# Patient Record
Sex: Female | Born: 1965
Health system: Southern US, Community
[De-identification: ages and names within clinical notes are randomized; demographics above are authoritative.]

## PROBLEM LIST (undated history)

## (undated) DIAGNOSIS — J988 Other specified respiratory disorders: Secondary | ICD-10-CM

## (undated) DIAGNOSIS — T7840XA Allergy, unspecified, initial encounter: Secondary | ICD-10-CM

## (undated) DIAGNOSIS — Z8619 Personal history of other infectious and parasitic diseases: Secondary | ICD-10-CM

## (undated) DIAGNOSIS — Z124 Encounter for screening for malignant neoplasm of cervix: Secondary | ICD-10-CM

## (undated) DIAGNOSIS — K219 Gastro-esophageal reflux disease without esophagitis: Secondary | ICD-10-CM

## (undated) DIAGNOSIS — E785 Hyperlipidemia, unspecified: Secondary | ICD-10-CM

## (undated) DIAGNOSIS — F419 Anxiety disorder, unspecified: Secondary | ICD-10-CM

## (undated) DIAGNOSIS — Z8719 Personal history of other diseases of the digestive system: Secondary | ICD-10-CM

## (undated) DIAGNOSIS — Z720 Tobacco use: Secondary | ICD-10-CM

## (undated) DIAGNOSIS — F329 Major depressive disorder, single episode, unspecified: Secondary | ICD-10-CM

## (undated) DIAGNOSIS — B9789 Other viral agents as the cause of diseases classified elsewhere: Secondary | ICD-10-CM

## (undated) DIAGNOSIS — K859 Acute pancreatitis without necrosis or infection, unspecified: Secondary | ICD-10-CM

## (undated) HISTORY — DX: Personal history of other infectious and parasitic diseases: Z86.19

## (undated) HISTORY — DX: Allergy, unspecified, initial encounter: T78.40XA

## (undated) HISTORY — DX: Other viral agents as the cause of diseases classified elsewhere: B97.89

## (undated) HISTORY — PX: TUBAL LIGATION: SHX77

## (undated) HISTORY — DX: Hyperlipidemia, unspecified: E78.5

## (undated) HISTORY — DX: Gastro-esophageal reflux disease without esophagitis: K21.9

## (undated) HISTORY — DX: Encounter for screening for malignant neoplasm of cervix: Z12.4

## (undated) HISTORY — DX: Major depressive disorder, single episode, unspecified: F32.9

## (undated) HISTORY — DX: Tobacco use: Z72.0

## (undated) HISTORY — DX: Personal history of other diseases of the digestive system: Z87.19

## (undated) HISTORY — DX: Other specified respiratory disorders: J98.8

## (undated) HISTORY — DX: Anxiety disorder, unspecified: F41.9

## (undated) HISTORY — DX: Acute pancreatitis without necrosis or infection, unspecified: K85.90

---

## 2005-12-18 LAB — HM MAMMOGRAPHY: HM Mammogram: NORMAL

## 2011-12-19 LAB — HM PAP SMEAR: HM Pap smear: NORMAL

## 2016-01-31 ENCOUNTER — Telehealth: Payer: Self-pay | Admitting: *Deleted

## 2016-01-31 ENCOUNTER — Encounter: Payer: Self-pay | Admitting: *Deleted

## 2016-01-31 NOTE — Telephone Encounter (Signed)
Pre-Visit Call completed with patient and chart updated.   Pre-Visit Info documented in Specialty Comments under [employer].    

## 2016-01-31 NOTE — Telephone Encounter (Signed)
Unable to reach patient at time of pre-visit call. Left message for patient to return call when available.  

## 2016-02-01 ENCOUNTER — Ambulatory Visit (INDEPENDENT_AMBULATORY_CARE_PROVIDER_SITE_OTHER): Payer: BLUE CROSS/BLUE SHIELD | Admitting: Family Medicine

## 2016-02-01 ENCOUNTER — Encounter: Payer: Self-pay | Admitting: Family Medicine

## 2016-02-01 VITALS — BP 108/70 | HR 90 | Temp 98.0°F | Ht 60.0 in | Wt 130.4 lb

## 2016-02-01 DIAGNOSIS — Z8619 Personal history of other infectious and parasitic diseases: Secondary | ICD-10-CM | POA: Insufficient documentation

## 2016-02-01 DIAGNOSIS — G4700 Insomnia, unspecified: Secondary | ICD-10-CM

## 2016-02-01 DIAGNOSIS — Z1239 Encounter for other screening for malignant neoplasm of breast: Secondary | ICD-10-CM

## 2016-02-01 DIAGNOSIS — Z0000 Encounter for general adult medical examination without abnormal findings: Secondary | ICD-10-CM

## 2016-02-01 DIAGNOSIS — K219 Gastro-esophageal reflux disease without esophagitis: Secondary | ICD-10-CM | POA: Diagnosis not present

## 2016-02-01 DIAGNOSIS — F418 Other specified anxiety disorders: Secondary | ICD-10-CM

## 2016-02-01 DIAGNOSIS — T7840XA Allergy, unspecified, initial encounter: Secondary | ICD-10-CM

## 2016-02-01 DIAGNOSIS — F329 Major depressive disorder, single episode, unspecified: Secondary | ICD-10-CM

## 2016-02-01 DIAGNOSIS — F419 Anxiety disorder, unspecified: Secondary | ICD-10-CM

## 2016-02-01 DIAGNOSIS — F32A Depression, unspecified: Secondary | ICD-10-CM

## 2016-02-01 MED ORDER — ESCITALOPRAM OXALATE 10 MG PO TABS: 10.0000 mg | ORAL_TABLET | Freq: Every day | ORAL | Status: DC

## 2016-02-01 MED ORDER — ZOLPIDEM TARTRATE 10 MG PO TABS
10.0000 mg | ORAL_TABLET | Freq: Every evening | ORAL | Status: DC | PRN
Start: 2016-02-01 — End: 2016-10-12

## 2016-02-01 NOTE — Patient Instructions (Signed)
NOW probiotics 10 strains in 1 cap daily [email or web address] 64 oz of clear fluids Psyllium/Metmucil powder in juice Benefiber daily   Preventive Care for Adults, Female A healthy lifestyle and preventive care can promote health and wellness. Preventive health guidelines for women include the following key practices.  A routine yearly physical is a good way to check with your health care provider about your health and preventive screening. It is a chance to share any concerns and updates on your health and to receive a thorough exam.  Visit your dentist for a routine exam and preventive care every 6 months. Brush your teeth twice a day and floss once a day. Good oral hygiene prevents tooth decay and gum disease.  The frequency of eye exams is based on your age, health, family medical history, use of contact lenses, and other factors. Follow your health care provider's recommendations for frequency of eye exams.  Eat a healthy diet. Foods like vegetables, fruits, whole grains, low-fat dairy products, and lean protein foods contain the nutrients you need without too many calories. Decrease your intake of foods high in solid fats, added sugars, and salt. Eat the right amount of calories for you.Get information about a proper diet from your health care provider, if necessary.  Regular physical exercise is one of the most important things you can do for your health. Most adults should get at least 150 minutes of moderate-intensity exercise (any activity that increases your heart rate and causes you to sweat) each week. In addition, most adults need muscle-strengthening exercises on 2 or more days a week.  Maintain a healthy weight. The body mass index (BMI) is a screening tool to identify possible weight problems. It provides an estimate of body fat based on height and weight. Your health care provider can find your BMI and can help you achieve or maintain a healthy weight.For adults 20 years and  older:  A BMI below 18.5 is considered underweight.  A BMI of 18.5 to 24.9 is normal.  A BMI of 25 to 29.9 is considered overweight.  A BMI of 30 and above is considered obese.  Maintain normal blood lipids and cholesterol levels by exercising and minimizing your intake of saturated fat. Eat a balanced diet with plenty of fruit and vegetables. Blood tests for lipids and cholesterol should begin at age 52 and be repeated every 5 years. If your lipid or cholesterol levels are high, you are over 50, or you are at high risk for heart disease, you may need your cholesterol levels checked more frequently.Ongoing high lipid and cholesterol levels should be treated with medicines if diet and exercise are not working.  If you smoke, find out from your health care provider how to quit. If you do not use tobacco, do not start.  Lung cancer screening is recommended for adults aged 34-80 years who are at high risk for developing lung cancer because of a history of smoking. A yearly low-dose CT scan of the lungs is recommended for people who have at least a 30-pack-year history of smoking and are a current smoker or have quit within the past 15 years. A pack year of smoking is smoking an average of 1 pack of cigarettes a day for 1 year (for example: 1 pack a day for 30 years or 2 packs a day for 15 years). Yearly screening should continue until the smoker has stopped smoking for at least 15 years. Yearly screening should be stopped for people who develop a health  problem that would prevent them from having lung cancer treatment.  If you are pregnant, do not drink alcohol. If you are breastfeeding, be very cautious about drinking alcohol. If you are not pregnant and choose to drink alcohol, do not have more than 1 drink per day. One drink is considered to be 12 ounces (355 mL) of beer, 5 ounces (148 mL) of wine, or 1.5 ounces (44 mL) of liquor.  Avoid use of street drugs. Do not share needles with anyone. Ask  for help if you need support or instructions about stopping the use of drugs.  High blood pressure causes heart disease and increases the risk of stroke. Your blood pressure should be checked at least every 1 to 2 years. Ongoing high blood pressure should be treated with medicines if weight loss and exercise do not work.  If you are 38-49 years old, ask your health care provider if you should take aspirin to prevent strokes.  Diabetes screening is done by taking a blood sample to check your blood glucose level after you have not eaten for a certain period of time (fasting). If you are not overweight and you do not have risk factors for diabetes, you should be screened once every 3 years starting at age 37. If you are overweight or obese and you are 77-58 years of age, you should be screened for diabetes every year as part of your cardiovascular risk assessment.  Breast cancer screening is essential preventive care for women. You should practice "breast self-awareness." This means understanding the normal appearance and feel of your breasts and may include breast self-examination. Any changes detected, no matter how small, should be reported to a health care provider. Women in their 38s and 30s should have a clinical breast exam (CBE) by a health care provider as part of a regular health exam every 1 to 3 years. After age 36, women should have a CBE every year. Starting at age 52, women should consider having a mammogram (breast X-ray test) every year. Women who have a family history of breast cancer should talk to their health care provider about genetic screening. Women at a high risk of breast cancer should talk to their health care providers about having an MRI and a mammogram every year.  Breast cancer gene (BRCA)-related cancer risk assessment is recommended for women who have family members with BRCA-related cancers. BRCA-related cancers include breast, ovarian, tubal, and peritoneal cancers. Having  family members with these cancers may be associated with an increased risk for harmful changes (mutations) in the breast cancer genes BRCA1 and BRCA2. Results of the assessment will determine the need for genetic counseling and BRCA1 and BRCA2 testing.  Your health care provider may recommend that you be screened regularly for cancer of the pelvic organs (ovaries, uterus, and vagina). This screening involves a pelvic examination, including checking for microscopic changes to the surface of your cervix (Pap test). You may be encouraged to have this screening done every 3 years, beginning at age 63.  For women ages 63-65, health care providers may recommend pelvic exams and Pap testing every 3 years, or they may recommend the Pap and pelvic exam, combined with testing for human papilloma virus (HPV), every 5 years. Some types of HPV increase your risk of cervical cancer. Testing for HPV may also be done on women of any age with unclear Pap test results.  Other health care providers may not recommend any screening for nonpregnant women who are considered low risk  for pelvic cancer and who do not have symptoms. Ask your health care provider if a screening pelvic exam is right for you.  If you have had past treatment for cervical cancer or a condition that could lead to cancer, you need Pap tests and screening for cancer for at least 20 years after your treatment. If Pap tests have been discontinued, your risk factors (such as having a new sexual partner) need to be reassessed to determine if screening should resume. Some women have medical problems that increase the chance of getting cervical cancer. In these cases, your health care provider may recommend more frequent screening and Pap tests.  Colorectal cancer can be detected and often prevented. Most routine colorectal cancer screening begins at the age of 45 years and continues through age 47 years. However, your health care provider may recommend  screening at an earlier age if you have risk factors for colon cancer. On a yearly basis, your health care provider may provide home test kits to check for hidden blood in the stool. Use of a small camera at the end of a tube, to directly examine the colon (sigmoidoscopy or colonoscopy), can detect the earliest forms of colorectal cancer. Talk to your health care provider about this at age 68, when routine screening begins. Direct exam of the colon should be repeated every 5-10 years through age 57 years, unless early forms of precancerous polyps or small growths are found.  People who are at an increased risk for hepatitis B should be screened for this virus. You are considered at high risk for hepatitis B if:  You were born in a country where hepatitis B occurs often. Talk with your health care provider about which countries are considered high risk.  Your parents were born in a high-risk country and you have not received a shot to protect against hepatitis B (hepatitis B vaccine).  You have HIV or AIDS.  You use needles to inject street drugs.  You live with, or have sex with, someone who has hepatitis B.  You get hemodialysis treatment.  You take certain medicines for conditions like cancer, organ transplantation, and autoimmune conditions.  Hepatitis C blood testing is recommended for all people born from [birth year] through 1965 and any individual with known risks for hepatitis C.  Practice safe sex. Use condoms and avoid high-risk sexual practices to reduce the spread of sexually transmitted infections (STIs). STIs include gonorrhea, chlamydia, syphilis, trichomonas, herpes, HPV, and human immunodeficiency virus (HIV). Herpes, HIV, and HPV are viral illnesses that have no cure. They can result in disability, cancer, and death.  You should be screened for sexually transmitted illnesses (STIs) including gonorrhea and chlamydia if:  You are sexually active and are younger than 24 years.  You  are older than 24 years and your health care provider tells you that you are at risk for this type of infection.  Your sexual activity has changed since you were last screened and you are at an increased risk for chlamydia or gonorrhea. Ask your health care provider if you are at risk.  If you are at risk of being infected with HIV, it is recommended that you take a prescription medicine daily to prevent HIV infection. This is called preexposure prophylaxis (PrEP). You are considered at risk if:  You are sexually active and do not regularly use condoms or know the HIV status of your partner(s).  You take drugs by injection.  You are sexually active with a partner who  has HIV.  Talk with your health care provider about whether you are at high risk of being infected with HIV. If you choose to begin PrEP, you should first be tested for HIV. You should then be tested every 3 months for as long as you are taking PrEP.  Osteoporosis is a disease in which the bones lose minerals and strength with aging. This can result in serious bone fractures or breaks. The risk of osteoporosis can be identified using a bone density scan. Women ages 9 years and over and women at risk for fractures or osteoporosis should discuss screening with their health care providers. Ask your health care provider whether you should take a calcium supplement or vitamin D to reduce the rate of osteoporosis.  Menopause can be associated with physical symptoms and risks. Hormone replacement therapy is available to decrease symptoms and risks. You should talk to your health care provider about whether hormone replacement therapy is right for you.  Use sunscreen. Apply sunscreen liberally and repeatedly throughout the day. You should seek shade when your shadow is shorter than you. Protect yourself by wearing long sleeves, pants, a wide-brimmed hat, and sunglasses year round, whenever you are outdoors.  Once a month, do a whole body  skin exam, using a mirror to look at the skin on your back. Tell your health care provider of new moles, moles that have irregular borders, moles that are larger than a pencil eraser, or moles that have changed in shape or color.  Stay current with required vaccines (immunizations).  Influenza vaccine. All adults should be immunized every year.  Tetanus, diphtheria, and acellular pertussis (Td, Tdap) vaccine. Pregnant women should receive 1 dose of Tdap vaccine during each pregnancy. The dose should be obtained regardless of the length of time since the last dose. Immunization is preferred during the 27th-36th week of gestation. An adult who has not previously received Tdap or who does not know her vaccine status should receive 1 dose of Tdap. This initial dose should be followed by tetanus and diphtheria toxoids (Td) booster doses every 10 years. Adults with an unknown or incomplete history of completing a 3-dose immunization series with Td-containing vaccines should begin or complete a primary immunization series including a Tdap dose. Adults should receive a Td booster every 10 years.  Varicella vaccine. An adult without evidence of immunity to varicella should receive 2 doses or a second dose if she has previously received 1 dose. Pregnant females who do not have evidence of immunity should receive the first dose after pregnancy. This first dose should be obtained before leaving the health care facility. The second dose should be obtained 4-8 weeks after the first dose.  Human papillomavirus (HPV) vaccine. Females aged 13-26 years who have not received the vaccine previously should obtain the 3-dose series. The vaccine is not recommended for use in pregnant females. However, pregnancy testing is not needed before receiving a dose. If a female is found to be pregnant after receiving a dose, no treatment is needed. In that case, the remaining doses should be delayed until after the pregnancy.  Immunization is recommended for any person with an immunocompromised condition through the age of 77 years if she did not get any or all doses earlier. During the 3-dose series, the second dose should be obtained 4-8 weeks after the first dose. The third dose should be obtained 24 weeks after the first dose and 16 weeks after the second dose.  Zoster vaccine. One dose is  recommended for adults aged 42 years or older unless certain conditions are present.  Measles, mumps, and rubella (MMR) vaccine. Adults born before [birth year] generally are considered immune to measles and mumps. Adults born in [birth year] or later should have 1 or more doses of MMR vaccine unless there is a contraindication to the vaccine or there is laboratory evidence of immunity to each of the three diseases. A routine second dose of MMR vaccine should be obtained at least 28 days after the first dose for students attending postsecondary schools, health care workers, or international travelers. People who received inactivated measles vaccine or an unknown type of measles vaccine during 1963-1967 should receive 2 doses of MMR vaccine. People who received inactivated mumps vaccine or an unknown type of mumps vaccine before 1979 and are at high risk for mumps infection should consider immunization with 2 doses of MMR vaccine. For females of childbearing age, rubella immunity should be determined. If there is no evidence of immunity, females who are not pregnant should be vaccinated. If there is no evidence of immunity, females who are pregnant should delay immunization until after pregnancy. Unvaccinated health care workers born before [birth year] who lack laboratory evidence of measles, mumps, or rubella immunity or laboratory confirmation of disease should consider measles and mumps immunization with 2 doses of MMR vaccine or rubella immunization with 1 dose of MMR vaccine.  Pneumococcal 13-valent conjugate (PCV13) vaccine. When indicated, a person who is  uncertain of his immunization history and has no record of immunization should receive the PCV13 vaccine. All adults 31 years of age and older should receive this vaccine. An adult aged 18 years or older who has certain medical conditions and has not been previously immunized should receive 1 dose of PCV13 vaccine. This PCV13 should be followed with a dose of pneumococcal polysaccharide (PPSV23) vaccine. Adults who are at high risk for pneumococcal disease should obtain the PPSV23 vaccine at least 8 weeks after the dose of PCV13 vaccine. Adults older than 50 years of age who have normal immune system function should obtain the PPSV23 vaccine dose at least 1 year after the dose of PCV13 vaccine.  Pneumococcal polysaccharide (PPSV23) vaccine. When PCV13 is also indicated, PCV13 should be obtained first. All adults aged 69 years and older should be immunized. An adult younger than age 64 years who has certain medical conditions should be immunized. Any person who resides in a nursing home or long-term care facility should be immunized. An adult smoker should be immunized. People with an immunocompromised condition and certain other conditions should receive both PCV13 and PPSV23 vaccines. People with human immunodeficiency virus (HIV) infection should be immunized as soon as possible after diagnosis. Immunization during chemotherapy or radiation therapy should be avoided. Routine use of PPSV23 vaccine is not recommended for American Indians, [place]atives, or people younger than 65 years unless there are medical conditions that require PPSV23 vaccine. When indicated, people who have unknown immunization and have no record of immunization should receive PPSV23 vaccine. One-time revaccination 5 years after the first dose of PPSV23 is recommended for people aged 19-64 years who have chronic kidney failure, nephrotic syndrome, asplenia, or immunocompromised conditions. People who received 1-2 doses of PPSV23 before age  92 years should receive another dose of PPSV23 vaccine at age 56 years or later if at least 5 years have passed since the previous dose. Doses of PPSV23 are not needed for people immunized with PPSV23 at or after age 16 years.  Meningococcal vaccine. Adults  with asplenia or persistent complement component deficiencies should receive 2 doses of quadrivalent meningococcal conjugate (MenACWY-D) vaccine. The doses should be obtained at least 2 months apart. Microbiologists working with certain meningococcal bacteria, [employer] recruits, people at risk during an outbreak, and people who travel to or live in countries with a high rate of meningitis should be immunized. A first-year college student up through age 30 years who is living in a residence hall should receive a dose if she did not receive a dose on or after her 16th birthday. Adults who have certain high-risk conditions should receive one or more doses of vaccine.  Hepatitis A vaccine. Adults who wish to be protected from this disease, have certain high-risk conditions, work with hepatitis A-infected animals, work in hepatitis A research labs, or travel to or work in countries with a high rate of hepatitis A should be immunized. Adults who were previously unvaccinated and who anticipate close contact with an international adoptee during the first 60 days after arrival in the [place] from a country with a high rate of hepatitis A should be immunized.  Hepatitis B vaccine. Adults who wish to be protected from this disease, have certain high-risk conditions, may be exposed to blood or other infectious body fluids, are household contacts or sex partners of hepatitis B positive people, are clients or workers in certain care facilities, or travel to or work in countries with a high rate of hepatitis B should be immunized.  Haemophilus influenzae type b (Hib) vaccine. A previously unvaccinated person with asplenia or sickle cell disease or having a  scheduled splenectomy should receive 1 dose of Hib vaccine. Regardless of previous immunization, a recipient of a hematopoietic stem cell transplant should receive a 3-dose series 6-12 months after her successful transplant. Hib vaccine is not recommended for adults with HIV infection. Preventive Services / Frequency Ages 51 to 33 years  Blood pressure check.** / Every 3-5 years.  Lipid and cholesterol check.** / Every 5 years beginning at age 37.  Clinical breast exam.** / Every 3 years for women in their 64s and 10s.  BRCA-related cancer risk assessment.** / For women who have family members with a BRCA-related cancer (breast, ovarian, tubal, or peritoneal cancers).  Pap test.** / Every 2 years from ages 55 through 49. Every 3 years starting at age 81 through age 49 or 27 with a history of 3 consecutive normal Pap tests.  HPV screening.** / Every 3 years from ages 64 through ages 94 to 64 with a history of 3 consecutive normal Pap tests.  Hepatitis C blood test.** / For any individual with known risks for hepatitis C.  Skin self-exam. / Monthly.  Influenza vaccine. / Every year.  Tetanus, diphtheria, and acellular pertussis (Tdap, Td) vaccine.** / Consult your health care provider. Pregnant women should receive 1 dose of Tdap vaccine during each pregnancy. 1 dose of Td every 10 years.  Varicella vaccine.** / Consult your health care provider. Pregnant females who do not have evidence of immunity should receive the first dose after pregnancy.  HPV vaccine. / 3 doses over 6 months, if 34 and younger. The vaccine is not recommended for use in pregnant females. However, pregnancy testing is not needed before receiving a dose.  Measles, mumps, rubella (MMR) vaccine.** / You need at least 1 dose of MMR if you were born in 1957 or later. You may also need a 2nd dose. For females of childbearing age, rubella immunity should be determined. If there is  no evidence of immunity, females who are not  pregnant should be vaccinated. If there is no evidence of immunity, females who are pregnant should delay immunization until after pregnancy.  Pneumococcal 13-valent conjugate (PCV13) vaccine.** / Consult your health care provider.  Pneumococcal polysaccharide (PPSV23) vaccine.** / 1 to 2 doses if you smoke cigarettes or if you have certain conditions.  Meningococcal vaccine.** / 1 dose if you are age 48 to 24 years and a Market researcher living in a residence hall, or have one of several medical conditions, you need to get vaccinated against meningococcal disease. You may also need additional booster doses.  Hepatitis A vaccine.** / Consult your health care provider.  Hepatitis B vaccine.** / Consult your health care provider.  Haemophilus influenzae type b (Hib) vaccine.** / Consult your health care provider. Ages 46 to 50 years  Blood pressure check.** / Every year.  Lipid and cholesterol check.** / Every 5 years beginning at age 64 years.  Lung cancer screening. / Every year if you are aged 75-80 years and have a 30-pack-year history of smoking and currently smoke or have quit within the past 15 years. Yearly screening is stopped once you have quit smoking for at least 15 years or develop a health problem that would prevent you from having lung cancer treatment.  Clinical breast exam.** / Every year after age 28 years.  BRCA-related cancer risk assessment.** / For women who have family members with a BRCA-related cancer (breast, ovarian, tubal, or peritoneal cancers).  Mammogram.** / Every year beginning at age 28 years and continuing for as long as you are in good health. Consult with your health care provider.  Pap test.** / Every 3 years starting at age 39 years through age 17 or 32 years with a history of 3 consecutive normal Pap tests.  HPV screening.** / Every 3 years from ages 61 years through ages 2 to 48 years with a history of 3 consecutive normal Pap  tests.  Fecal occult blood test (FOBT) of stool. / Every year beginning at age 43 years and continuing until age 14 years. You may not need to do this test if you get a colonoscopy every 10 years.  Flexible sigmoidoscopy or colonoscopy.** / Every 5 years for a flexible sigmoidoscopy or every 10 years for a colonoscopy beginning at age 68 years and continuing until age 38 years.  Hepatitis C blood test.** / For all people born from [birth year] through 1965 and any individual with known risks for hepatitis C.  Skin self-exam. / Monthly.  Influenza vaccine. / Every year.  Tetanus, diphtheria, and acellular pertussis (Tdap/Td) vaccine.** / Consult your health care provider. Pregnant women should receive 1 dose of Tdap vaccine during each pregnancy. 1 dose of Td every 10 years.  Varicella vaccine.** / Consult your health care provider. Pregnant females who do not have evidence of immunity should receive the first dose after pregnancy.  Zoster vaccine.** / 1 dose for adults aged 72 years or older.  Measles, mumps, rubella (MMR) vaccine.** / You need at least 1 dose of MMR if you were born in 1957 or later. You may also need a second dose. For females of childbearing age, rubella immunity should be determined. If there is no evidence of immunity, females who are not pregnant should be vaccinated. If there is no evidence of immunity, females who are pregnant should delay immunization until after pregnancy.  Pneumococcal 13-valent conjugate (PCV13) vaccine.** / Consult your health care provider.  Pneumococcal  polysaccharide (PPSV23) vaccine.** / 1 to 2 doses if you smoke cigarettes or if you have certain conditions.  Meningococcal vaccine.** / Consult your health care provider.  Hepatitis A vaccine.** / Consult your health care provider.  Hepatitis B vaccine.** / Consult your health care provider.  Haemophilus influenzae type b (Hib) vaccine.** / Consult your health care provider. Ages 78 years and  over  Blood pressure check.** / Every year.  Lipid and cholesterol check.** / Every 5 years beginning at age 24 years.  Lung cancer screening. / Every year if you are aged 87-80 years and have a 30-pack-year history of smoking and currently smoke or have quit within the past 15 years. Yearly screening is stopped once you have quit smoking for at least 15 years or develop a health problem that would prevent you from having lung cancer treatment.  Clinical breast exam.** / Every year after age 34 years.  BRCA-related cancer risk assessment.** / For women who have family members with a BRCA-related cancer (breast, ovarian, tubal, or peritoneal cancers).  Mammogram.** / Every year beginning at age 59 years and continuing for as long as you are in good health. Consult with your health care provider.  Pap test.** / Every 3 years starting at age 76 years through age 72 or 5 years with 3 consecutive normal Pap tests. Testing can be stopped between 65 and 70 years with 3 consecutive normal Pap tests and no abnormal Pap or HPV tests in the past 10 years.  HPV screening.** / Every 3 years from ages 68 years through ages 51 or 56 years with a history of 3 consecutive normal Pap tests. Testing can be stopped between 65 and 70 years with 3 consecutive normal Pap tests and no abnormal Pap or HPV tests in the past 10 years.  Fecal occult blood test (FOBT) of stool. / Every year beginning at age 75 years and continuing until age 62 years. You may not need to do this test if you get a colonoscopy every 10 years.  Flexible sigmoidoscopy or colonoscopy.** / Every 5 years for a flexible sigmoidoscopy or every 10 years for a colonoscopy beginning at age 59 years and continuing until age 33 years.  Hepatitis C blood test.** / For all people born from [birth year] through 1965 and any individual with known risks for hepatitis C.  Osteoporosis screening.** / A one-time screening for women ages 58 years and over and women  at risk for fractures or osteoporosis.  Skin self-exam. / Monthly.  Influenza vaccine. / Every year.  Tetanus, diphtheria, and acellular pertussis (Tdap/Td) vaccine.** / 1 dose of Td every 10 years.  Varicella vaccine.** / Consult your health care provider.  Zoster vaccine.** / 1 dose for adults aged 96 years or older.  Pneumococcal 13-valent conjugate (PCV13) vaccine.** / Consult your health care provider.  Pneumococcal polysaccharide (PPSV23) vaccine.** / 1 dose for all adults aged 59 years and older.  Meningococcal vaccine.** / Consult your health care provider.  Hepatitis A vaccine.** / Consult your health care provider.  Hepatitis B vaccine.** / Consult your health care provider.  Haemophilus influenzae type b (Hib) vaccine.** / Consult your health care provider. ** Family history and personal history of risk and conditions may change your health care provider's recommendations.   This information is not intended to replace advice given to you by your health care provider. Make sure you discuss any questions you have with your health care provider.   Document Released: 01/30/2002 Document Revised: 12/25/2014  Document Reviewed: 05/01/2011 Elsevier Interactive Patient Education [employer].

## 2016-02-01 NOTE — Progress Notes (Signed)
Pre visit review using our clinic review tool, if applicable. No additional management support is needed unless otherwise documented below in the visit note. 

## 2016-02-06 ENCOUNTER — Encounter: Payer: Self-pay | Admitting: Family Medicine

## 2016-02-06 DIAGNOSIS — G4700 Insomnia, unspecified: Secondary | ICD-10-CM | POA: Insufficient documentation

## 2016-02-06 DIAGNOSIS — K219 Gastro-esophageal reflux disease without esophagitis: Secondary | ICD-10-CM | POA: Insufficient documentation

## 2016-02-06 DIAGNOSIS — F32A Depression, unspecified: Secondary | ICD-10-CM | POA: Insufficient documentation

## 2016-02-06 DIAGNOSIS — Z0000 Encounter for general adult medical examination without abnormal findings: Secondary | ICD-10-CM | POA: Insufficient documentation

## 2016-02-06 DIAGNOSIS — T7840XA Allergy, unspecified, initial encounter: Secondary | ICD-10-CM | POA: Insufficient documentation

## 2016-02-06 DIAGNOSIS — F329 Major depressive disorder, single episode, unspecified: Secondary | ICD-10-CM | POA: Insufficient documentation

## 2016-02-06 DIAGNOSIS — F419 Anxiety disorder, unspecified: Secondary | ICD-10-CM

## 2016-02-06 HISTORY — DX: Depression, unspecified: F32.A

## 2016-02-06 NOTE — Assessment & Plan Note (Signed)
Avoid offending foods, start probiotics. Do not eat large meals in late evening and consider raising head of bed.  

## 2016-02-06 NOTE — Assessment & Plan Note (Signed)
Encouraged good sleep hygiene such as dark, quiet room. No blue/green glowing lights such as computer screens in bedroom. No alcohol or stimulants in evening. Cut down on caffeine as able. Regular exercise is helpful but not just prior to bed time. Allowed Ambien prn

## 2016-02-06 NOTE — Assessment & Plan Note (Signed)
fexofendine prn

## 2016-02-06 NOTE — Assessment & Plan Note (Signed)
Lexapro prescribed. Reassess at next visit or as needed

## 2016-02-06 NOTE — Progress Notes (Signed)
Patient ID: Jarissa Sheriff, female   DOB: 17-Apr-1966, 50 y.o.   MRN: [id number]   Subjective:    Patient ID: Devonna Oboyle, female    DOB: 1966-10-10, 50 y.o.   MRN: [id number]  Chief Complaint  Patient presents with  . Establish Care    HPI Patient is in today for new patient appointment. She struggles with allergies, heartburn and more recently has been struggled with increased stress, anhedonia and poor sleep. Her step father who raised her has just died and she is helping her mother. Also her 21 sister has died. She is having trouble is staying asleep and falling asleep has been worsening over past 3 months as has her anhedonia. She denies suicidal ideation. Denies CP/palp/SOB/HA/congestion/fevers/GI or GU c/o. Taking meds as prescribed  Past Medical History  Diagnosis Date  . History of chicken pox   . Allergy   . GERD (gastroesophageal reflux disease)   . Anxiety and depression 02/06/2016    Past Surgical History  Procedure Laterality Date  . Tubal ligation  "about 20 yrs ago"    Family History  Problem Relation Age of Onset  . Diabetes Maternal Grandmother   . Drug abuse Father   . Cancer Father   . Cirrhosis Sister   . Alcohol abuse Sister   . Diverticulitis Mother   . Cancer Daughter     skin  . Aneurysm Maternal Aunt     brain    Social History   Social History  . Marital Status: Divorced    Spouse Name: N/A  . Number of Children: N/A  . Years of Education: 12   Occupational History  . [employer]    Social History Main Topics  . Smoking status: Current Every Day Smoker -- 1.00 packs/day for 30 years    Types: Cigarettes  . Smokeless tobacco: Not on file  . Alcohol Use: 0.0 oz/week    0 Standard drinks or equivalent per week     Comment: occasional  . Drug Use: No  . 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 Not on file    Social History Narrative    Outpatient Prescriptions Prior to Visit  Medication Sig Dispense Refill  . acetaminophen (TYLENOL) 325 MG tablet Take 650 mg by mouth every 6 (six) hours as needed for headache.     No facility-administered medications prior to visit.    No Known Allergies  Review of Systems  Constitutional: Positive for malaise/fatigue. Negative for fever and chills.  HENT: Negative for congestion and hearing loss.   Eyes: Negative for discharge.  Respiratory: Negative for cough, sputum production and shortness of breath.   Cardiovascular: Negative for chest pain, palpitations and leg swelling.  Gastrointestinal: Positive for heartburn. Negative for nausea, vomiting, abdominal pain, diarrhea, constipation and blood in stool.  Genitourinary: Negative for dysuria, urgency, frequency and hematuria.  Musculoskeletal: Negative for myalgias, back pain and falls.  Skin: Negative for rash.  Neurological: Negative for dizziness, sensory change, loss of consciousness, weakness and headaches.  Endo/Heme/Allergies: Negative for environmental allergies. Does not bruise/bleed easily.  Psychiatric/Behavioral: Positive for depression. Negative for suicidal ideas. The patient is nervous/anxious and has insomnia.        Objective:    Physical Exam  Constitutional: She is oriented to person, place, and time. She appears well-developed and well-nourished. No distress.  HENT:  Head: Normocephalic  and atraumatic.  Eyes: Conjunctivae are normal.  Neck: Neck supple. No thyromegaly present.  Cardiovascular: Normal rate, regular rhythm and normal heart sounds.   No murmur heard. Pulmonary/Chest: Effort normal and breath sounds normal. No respiratory distress.  Abdominal: Soft. Bowel sounds are normal. She exhibits no distension and no mass. There is no tenderness.  Musculoskeletal: She exhibits no edema.  Lymphadenopathy:    She has no cervical adenopathy.  Neurological: She is alert  and oriented to person, place, and time.  Skin: Skin is warm and dry.  Psychiatric: She has a normal mood and affect. Her behavior is normal.    BP 108/70 mmHg  Pulse 90  Temp(Src) 98 F (36.7 C) (Oral)  Ht 5' (1.524 m)  Wt 130 lb 6 oz (59.138 kg)  BMI 25.46 kg/m2  SpO2 97%  LMP 01/07/2016 Wt Readings from Last 3 Encounters:  02/01/16 130 lb 6 oz (59.138 kg)     No results found for: WBC, HGB, HCT, PLT, GLUCOSE, CHOL, TRIG, HDL, LDLDIRECT, LDLCALC, ALT, AST, NA, K, CL, CREATININE, BUN, CO2, TSH, PSA, INR, GLUF, HGBA1C, MICROALBUR  No results found for: TSH No results found for: WBC, HGB, HCT, MCV, PLT No results found for: NA, K, CHLORIDE, CO2, GLUCOSE, BUN, CREATININE, BILITOT, ALKPHOS, AST, ALT, PROT, ALBUMIN, CALCIUM, ANIONGAP, EGFR, GFR No results found for: CHOL No results found for: HDL No results found for: LDLCALC No results found for: TRIG No results found for: CHOLHDL No results found for: HGBA1C     Assessment & Plan:   Problem List Items Addressed This Visit    Allergy    fexofendine prn      Anxiety and depression    Lexapro prescribed. Reassess at next visit or as needed      GERD (gastroesophageal reflux disease)    Avoid offending foods, start probiotics. Do not eat large meals in late evening and consider raising head of bed.       Insomnia    Encouraged good sleep hygiene such as dark, quiet room. No blue/green glowing lights such as computer screens in bedroom. No alcohol or stimulants in evening. Cut down on caffeine as able. Regular exercise is helpful but not just prior to bed time. Allowed Ambien prn      Preventative health care    Other Visit Diagnoses    Breast cancer screening    -  Primary    Relevant Orders    MM Digital Screening       I am having Ms. Begley start on escitalopram and zolpidem. I am also having her maintain her acetaminophen.  Meds ordered this encounter  Medications  . escitalopram (LEXAPRO) 10 MG tablet     Sig: Take 1 tablet (10 mg total) by mouth daily.    Dispense:  30 tablet    Refill:  3  . zolpidem (AMBIEN) 10 MG tablet    Sig: Take 1 tablet (10 mg total) by mouth at bedtime as needed for sleep.    Dispense:  30 tablet    Refill:  1     Penni Homans, MD

## 2016-02-08 ENCOUNTER — Ambulatory Visit (HOSPITAL_BASED_OUTPATIENT_CLINIC_OR_DEPARTMENT_OTHER)
Admission: RE | Admit: 2016-02-08 | Discharge: 2016-02-08 | Disposition: A | Discharge status: Home / Self Care | Payer: BLUE CROSS/BLUE SHIELD | Source: Ambulatory Visit | Attending: Family Medicine | Admitting: Family Medicine

## 2016-02-08 DIAGNOSIS — Z1239 Encounter for other screening for malignant neoplasm of breast: Secondary | ICD-10-CM | POA: Diagnosis present

## 2016-02-08 DIAGNOSIS — Z1231 Encounter for screening mammogram for malignant neoplasm of breast: Secondary | ICD-10-CM | POA: Diagnosis not present

## 2016-04-26 ENCOUNTER — Encounter: Payer: Self-pay | Admitting: Medical

## 2016-04-26 ENCOUNTER — Ambulatory Visit (HOSPITAL_BASED_OUTPATIENT_CLINIC_OR_DEPARTMENT_OTHER)
Admission: RE | Admit: 2016-04-26 | Discharge: 2016-04-26 | Disposition: A | Discharge status: Home / Self Care | Payer: BLUE CROSS/BLUE SHIELD | Source: Ambulatory Visit | Attending: Medical | Admitting: Medical

## 2016-04-26 ENCOUNTER — Ambulatory Visit (INDEPENDENT_AMBULATORY_CARE_PROVIDER_SITE_OTHER): Payer: BLUE CROSS/BLUE SHIELD | Admitting: Medical

## 2016-04-26 VITALS — BP 110/74 | HR 80 | Temp 98.3°F | Ht 60.0 in | Wt 132.1 lb

## 2016-04-26 DIAGNOSIS — Z720 Tobacco use: Secondary | ICD-10-CM | POA: Diagnosis not present

## 2016-04-26 DIAGNOSIS — R059 Cough, unspecified: Secondary | ICD-10-CM

## 2016-04-26 DIAGNOSIS — R062 Wheezing: Secondary | ICD-10-CM | POA: Diagnosis not present

## 2016-04-26 DIAGNOSIS — F17200 Nicotine dependence, unspecified, uncomplicated: Secondary | ICD-10-CM

## 2016-04-26 DIAGNOSIS — R05 Cough: Secondary | ICD-10-CM | POA: Diagnosis not present

## 2016-04-26 DIAGNOSIS — J0100 Acute maxillary sinusitis, unspecified: Secondary | ICD-10-CM | POA: Diagnosis not present

## 2016-04-26 DIAGNOSIS — J209 Acute bronchitis, unspecified: Secondary | ICD-10-CM | POA: Diagnosis not present

## 2016-04-26 DIAGNOSIS — R0602 Shortness of breath: Secondary | ICD-10-CM | POA: Diagnosis not present

## 2016-04-26 MED ORDER — HYDROCODONE-HOMATROPINE 5-1.5 MG/5ML PO SYRP: 5.0000 mL | ORAL_SOLUTION | Freq: Three times a day (TID) | ORAL | Status: DC | PRN

## 2016-04-26 MED ORDER — PREDNISONE 20 MG PO TABS: ORAL_TABLET | ORAL | Status: DC

## 2016-04-26 NOTE — Progress Notes (Signed)
Pre visit review using our clinic review tool, if applicable. No additional management support is needed unless otherwise documented below in the visit note. 

## 2016-04-26 NOTE — Progress Notes (Signed)
Subjective:    Patient ID: Monica Dillon, female    DOB: [birth year], 50 y.o.   MRN: [id number]  HPI  Pt in with cough. [facility]. Pt was given zpack and benzonatate. Pt has cough during day and night. Pt has used inhalers in the past with bronchitis. Inhaler is not helping. Pt can feel some wheezing at night. Pt thinks hears gurgle also at night. In morning will bring up mucous. No fever, no chills or sweats. Pt states coughing overall for 10 days.   Pt has no diabetes.  Pt also has some nasal congestion and sinus pressure. Pt can feel pnd.  Pt smokes less than 1/2 pack a day.  Pt actually finished 3 day course azithromycin.     Review of Systems  Constitutional: Negative for fever, chills and fatigue.  HENT: Positive for congestion, postnasal drip and sinus pressure.   Respiratory: Positive for cough and wheezing. Negative for chest tightness and shortness of breath.   Cardiovascular: Negative for chest pain and palpitations.  Musculoskeletal: Negative for back pain.  Neurological: Negative for dizziness, speech difficulty, weakness, numbness and headaches.  Hematological: Negative for adenopathy. Does not bruise/bleed easily.  Psychiatric/Behavioral: Negative for behavioral problems and confusion.    Past Medical History  Diagnosis Date  . History of chicken pox   . Allergy   . GERD (gastroesophageal reflux disease)   . Anxiety and depression 02/06/2016     Social History   Social History  . Marital Status: Divorced    Spouse Name: N/A  . Number of Children: N/A  . Years of Education: 12   Occupational History  . [employer]    Social History Main Topics  . Smoking status: Current Every Day Smoker -- 1.00 packs/day for 30 years    Types: Cigarettes  . Smokeless tobacco: Not on file  . Alcohol Use: 0.0 oz/week    0 Standard drinks or equivalent per week     Comment: occasional  . Drug Use: No  . 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 Not on file   Social History Narrative    Past Surgical History  Procedure Laterality Date  . Tubal ligation  "about 20 yrs ago"    Family History  Problem Relation Age of Onset  . Diabetes Maternal Grandmother   . Drug abuse Father   . Cancer Father   . Cirrhosis Sister   . Alcohol abuse Sister   . Diverticulitis Mother   . Cancer Daughter     skin  . Aneurysm Maternal Aunt     brain    No Known Allergies  Current Outpatient Prescriptions on File Prior to Visit  Medication Sig Dispense Refill  . acetaminophen (TYLENOL) 325 MG tablet Take 650 mg by mouth every 6 (six) hours as needed for headache.    . escitalopram (LEXAPRO) 10 MG tablet Take 1 tablet (10 mg total) by mouth daily. 30 tablet 3  . zolpidem (AMBIEN) 10 MG tablet Take 1 tablet (10 mg total) by mouth at bedtime as needed for sleep. 30 tablet 1   No current facility-administered medications on file prior to visit.    BP 110/74 mmHg  Pulse 80  Temp(Src) 98.3 F (36.8 C) (Oral)  Ht 5' (1.524 m)  Wt 132 lb 1.6 oz (59.92 kg)  BMI  25.80 kg/m2  SpO2 96%  LMP 04/01/2016       Objective:   Physical Exam  General  Mental Status - Alert. General Appearance - Well groomed. Not in acute distress.  Skin Rashes- No Rashes.  HEENT Head- Normal. Ear Auditory Canal - Left- Normal. Right - Normal.Tympanic Membrane- Left- Normal. Right- Normal. Eye Sclera/Conjunctiva- Left- Normal. Right- Normal. Nose & Sinuses Nasal Mucosa- Left-  Boggy and Congested. Right-  Boggy and  Congested.Bilateral maxillary and frontal sinus pressure. Mouth & Throat Lips: Upper Lip- Normal: no dryness, cracking, pallor, cyanosis, or vesicular eruption. Lower Lip-Normal: no dryness, cracking, pallor, cyanosis or vesicular eruption. Buccal Mucosa- Bilateral- No Aphthous ulcers. Oropharynx- No  Discharge or Erythema. Tonsils: Characteristics- Bilateral- No Erythema or Congestion. Size/Enlargement- Bilateral- No enlargement. Discharge- bilateral-None.  Neck Neck- Supple. No Masses.   Chest and Lung Exam Auscultation: Breath Sounds:-Clear even and unlabored.  Cardiovascular Auscultation:Rythm- Regular, rate and rhythm. Murmurs & Other Heart Sounds:Ausculatation of the heart reveal- No Murmurs.  Lymphatic Head & Neck General Head & Neck Lymphatics: Bilateral: Description- No Localized lymphadenopathy.       Assessment & Plan:  For bronchitis and sinusitis may give another antibiotic but want to get cxr first and review.  For cough rx hycodan. Stop benzonatate.   For wheezing add taper dose prednisone. Continue albuterol inhaler.  Stop smoking or decrease recommended.  Follow up in 7 days or as needed  Dossie Swor, Ramon DredgeEdward, VF CorporationPA-C

## 2016-04-26 NOTE — Patient Instructions (Signed)
For bronchitis and sinusitis may give another antibiotic but want to get cxr first and review.  For cough rx hycodan. Stop benzonatate.   For wheezing add taper dose prednisone. Continue albuterol inhaler.  Stop smoking or decrease recommended.  Follow up in 7 days or as needed

## 2016-04-28 ENCOUNTER — Telehealth: Payer: Self-pay

## 2016-04-28 MED ORDER — DOXYCYCLINE HYCLATE 100 MG PO TABS: 100.0000 mg | ORAL_TABLET | Freq: Two times a day (BID) | ORAL | Status: DC

## 2016-04-28 NOTE — Telephone Encounter (Signed)
Since pt cough is productive and we are heading toward weekend. I think rx antibiotic  is good idea. I wanted to see how she did with prednisone and cough med I rx'd. But since still productive cough will send in doxycycline to her pharmacy. She could stop allergy medicine and ambien presently. Follow up Monday or Tuesday if not improved.

## 2016-04-28 NOTE — Telephone Encounter (Signed)
Spoke with pt and she states that she is having the cough. She has been taking the prednisone and the cough medication. She states that she had recently started a new allergy medication [place] and she is concerned that it may be affecting the medication that was given at the last visit. Pt states that she is having some "stuff" come up when she coughs. Pt states that she started feeling worse last night. Please advise if she will need to adjust taking the allergy medications while on the medications given at last visit.

## 2016-04-28 NOTE — Telephone Encounter (Signed)
Left message for pt to call back about the medication that was prescribed by E. Saguier at the last visit on 04/26/16.

## 2016-04-28 NOTE — Telephone Encounter (Signed)
Left detailed message for pt advising her of the note below and advised her that if she was not feeling better to follow up next Monday or Tuesday if she was not feeling any better. She was advised to call back with any questions about the medication Ramon DredgeEdward had sent into the pharmacy for her.

## 2016-05-02 ENCOUNTER — Ambulatory Visit (INDEPENDENT_AMBULATORY_CARE_PROVIDER_SITE_OTHER): Payer: BLUE CROSS/BLUE SHIELD | Admitting: Family Medicine

## 2016-05-02 ENCOUNTER — Encounter: Payer: Self-pay | Admitting: Family Medicine

## 2016-05-02 VITALS — BP 112/74 | HR 93 | Temp 98.1°F | Ht 60.0 in | Wt 132.2 lb

## 2016-05-02 DIAGNOSIS — Z0001 Encounter for general adult medical examination with abnormal findings: Secondary | ICD-10-CM | POA: Diagnosis not present

## 2016-05-02 DIAGNOSIS — K219 Gastro-esophageal reflux disease without esophagitis: Secondary | ICD-10-CM

## 2016-05-02 DIAGNOSIS — Z0000 Encounter for general adult medical examination without abnormal findings: Secondary | ICD-10-CM

## 2016-05-02 DIAGNOSIS — R05 Cough: Secondary | ICD-10-CM

## 2016-05-02 DIAGNOSIS — G4700 Insomnia, unspecified: Secondary | ICD-10-CM | POA: Diagnosis not present

## 2016-05-02 DIAGNOSIS — R059 Cough, unspecified: Secondary | ICD-10-CM

## 2016-05-02 MED ORDER — METHYLPREDNISOLONE ACETATE 40 MG/ML IJ SUSP: 40.0000 mg | Freq: Once | INTRAMUSCULAR | Status: DC

## 2016-05-02 MED ORDER — RANITIDINE HCL 300 MG PO TABS: 300.0000 mg | ORAL_TABLET | Freq: Every day | ORAL | Status: DC

## 2016-05-02 MED ORDER — METHYLPREDNISOLONE ACETATE 40 MG/ML IJ SUSP
40.0000 mg | Freq: Once | INTRAMUSCULAR | Status: AC
  Administered 2016-05-02: 40 mg via INTRAMUSCULAR

## 2016-05-02 NOTE — Patient Instructions (Addendum)
Release of record [facility] [address]. Smoking Cessation, Tips for Success If you are ready to quit smoking, congratulations! You have chosen to help yourself be healthier. Cigarettes bring nicotine, tar, carbon monoxide, and other irritants into your body. Your lungs, heart, and blood vessels will be able to work better without these poisons. There are many different ways to quit smoking. Nicotine gum, nicotine patches, a nicotine inhaler, or nicotine nasal spray can help with physical craving. Hypnosis, support groups, and medicines help break the habit of smoking. WHAT THINGS CAN I DO TO MAKE QUITTING EASIER?  Here are some tips to help you quit for good:  Pick a date when you will quit smoking completely. Tell all of your friends and family about your plan to quit on that date.  Do not try to slowly cut down on the number of cigarettes you are smoking. Pick a quit date and quit smoking completely starting on that day.  Throw away all cigarettes.   Clean and remove all ashtrays from your home, work, and car.  On a card, write down your reasons for quitting. Carry the card with you and read it when you get the urge to smoke.  Cleanse your body of nicotine. Drink enough water and fluids to keep your urine clear or pale yellow. Do this after quitting to flush the nicotine from your body.  Learn to predict your moods. Do not let a bad situation be your excuse to have a cigarette. Some situations in your life might tempt you into wanting a cigarette.  Never have "just one" cigarette. It leads to wanting another and another. Remind yourself of your decision to quit.  Change habits associated with smoking. If you smoked while driving or when feeling stressed, try other activities to replace smoking. Stand up when drinking your coffee. Brush your teeth after eating. Sit in a different chair when you read the paper. Avoid alcohol while trying to quit, and try to drink fewer caffeinated  beverages. Alcohol and caffeine may urge you to smoke.  Avoid foods and drinks that can trigger a desire to smoke, such as sugary or spicy foods and alcohol.  Ask people who smoke not to smoke around you.  Have something planned to do right after eating or having a cup of coffee. For example, plan to take a walk or exercise.  Try a relaxation exercise to calm you down and decrease your stress. Remember, you may be tense and nervous for the first 2 weeks after you quit, but this will pass.  Find new activities to keep your hands busy. Play with a pen, coin, or rubber band. Doodle or draw things on paper.  Brush your teeth right after eating. This will help cut down on the craving for the taste of tobacco after meals. You can also try mouthwash.   Use oral substitutes in place of cigarettes. Try using lemon drops, carrots, cinnamon sticks, or chewing gum. Keep them handy so they are available when you have the urge to smoke.  When you have the urge to smoke, try deep breathing.  Designate your home as a nonsmoking area.  If you are a heavy smoker, ask your health care provider about a prescription for nicotine chewing gum. It can ease your withdrawal from nicotine.  Reward yourself. Set aside the cigarette money you save and buy yourself something nice.  Look for support from others. Join a support group or smoking cessation program. Ask someone at home  or at work to help you with your plan to quit smoking.  Always ask yourself, "Do I need this cigarette or is this just a reflex?" Tell yourself, "Today, I choose not to smoke," or "I do not want to smoke." You are reminding yourself of your decision to quit.  Do not replace cigarette smoking with electronic cigarettes (commonly called e-cigarettes). The safety of e-cigarettes is unknown, and some may contain harmful chemicals.  If you relapse, do not give up! Plan ahead and think about what you will do the next time you get the urge to  smoke. HOW WILL I FEEL WHEN I QUIT SMOKING? You may have symptoms of withdrawal because your body is used to nicotine (the addictive substance in cigarettes). You may crave cigarettes, be irritable, feel very hungry, cough often, get headaches, or have difficulty concentrating. The withdrawal symptoms are only temporary. They are strongest when you first quit but will go away within 10-14 days. When withdrawal symptoms occur, stay in control. Think about your reasons for quitting. Remind yourself that these are signs that your body is healing and getting used to being without cigarettes. Remember that withdrawal symptoms are easier to treat than the major diseases that smoking can cause.  Even after the withdrawal is over, expect periodic urges to smoke. However, these cravings are generally short lived and will go away whether you smoke or not. Do not smoke! WHAT RESOURCES ARE AVAILABLE TO HELP ME QUIT SMOKING? Your health care provider can direct you to community resources or hospitals for support, which may include:  Group support.  Education.  Hypnosis.  Therapy.   This information is not intended to replace advice given to you by your health care provider. Make sure you discuss any questions you have with your health care provider.   Document Released: 09/01/2004 Document Revised: 12/25/2014 Document Reviewed: 05/22/2013 Elsevier Interactive Patient Education [employer].

## 2016-05-02 NOTE — Progress Notes (Signed)
Subjective:    Patient ID: Monica Dillon, female    DOB: 01/29/1966, 50 y.o.   MRN: [id number]  Chief Complaint  Patient presents with  . Follow-up    HPI Patient is in today for follow up.  She has a persistent cough this month has been treated via telehealth and an urgent visit as well but still has cough, congestion, malaise. Notes symptoms worsen in evening with wheezing and coughing. Denies CP/palp/HA/fevers/GI or GU c/o. Taking meds as prescribed    Past Medical History  Diagnosis Date  . History of chicken pox   . Allergy   . GERD (gastroesophageal reflux disease)   . Anxiety and depression 02/06/2016    Past Surgical History  Procedure Laterality Date  . Tubal ligation  "about 20 yrs ago"    Family History  Problem Relation Age of Onset  . Diabetes Maternal Grandmother   . Drug abuse Father   . Cancer Father   . Cirrhosis Sister   . Alcohol abuse Sister   . Diverticulitis Mother   . Cancer Daughter     skin  . Aneurysm Maternal Aunt     brain    Social History   Social History  . Marital Status: Divorced    Spouse Name: N/A  . Number of Children: N/A  . Years of Education: 12   Occupational History  . [employer]    Social History Main Topics  . Smoking status: Current Every Day Smoker -- 1.00 packs/day for 30 years    Types: Cigarettes  . Smokeless tobacco: Not on file  . Alcohol Use: 0.0 oz/week    0 Standard drinks or equivalent per week     Comment: occasional  . Drug Use: No  . 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 Not on file   Social History Narrative    Outpatient Prescriptions Prior to Visit  Medication Sig Dispense Refill  . acetaminophen (TYLENOL) 325 MG tablet Take 650 mg by mouth every 6 (six) hours as needed for headache.    . doxycycline (VIBRA-TABS) 100 MG tablet Take 1 tablet (100 mg total) by  mouth 2 (two) times daily. 20 tablet 0  . HYDROcodone-homatropine (HYCODAN) 5-1.5 MG/5ML syrup Take 5 mLs by mouth every 8 (eight) hours as needed for cough. 120 mL 0  . NON FORMULARY 1 capsule 3 (three) times daily. Pt takes three [employer] brand cough med    . VENTOLIN HFA 108 (90 Base) MCG/ACT inhaler     . zolpidem (AMBIEN) 10 MG tablet Take 1 tablet (10 mg total) by mouth at bedtime as needed for sleep. 30 tablet 1  . benzonatate (TESSALON) 100 MG capsule     . escitalopram (LEXAPRO) 10 MG tablet Take 1 tablet (10 mg total) by mouth daily. 30 tablet 3  . predniSONE (DELTASONE) 20 MG tablet 1 tab po tid day 1, 1 tab po bid day 2, then 1 tab po day 3. 6 tablet 0   No facility-administered medications prior to visit.    No Known Allergies  Review of Systems  Constitutional: Negative for fever and malaise/fatigue.  HENT: Negative for congestion.   Eyes: Negative for blurred vision.  Respiratory: Negative for shortness of breath.   Cardiovascular: Negative for chest pain, palpitations and leg swelling.  Gastrointestinal: Negative for nausea, abdominal pain and blood in stool.  Genitourinary: Negative for dysuria and frequency.  Musculoskeletal: Negative for falls.  Skin: Negative for rash.  Neurological: Negative for dizziness, loss of consciousness and headaches.  Endo/Heme/Allergies: Negative for environmental allergies.  Psychiatric/Behavioral: Negative for depression. The patient is not nervous/anxious.        Objective:    Physical Exam  Constitutional: She is oriented to person, place, and time. She appears well-developed and well-nourished. No distress.  HENT:  Head: Normocephalic and atraumatic.  Eyes: Conjunctivae are normal.  Neck: Neck supple. No thyromegaly present.  Cardiovascular: Normal rate, regular rhythm and normal heart sounds.   No murmur heard. Pulmonary/Chest: Effort normal and breath sounds normal. No respiratory distress.  Abdominal: Soft. Bowel  sounds are normal. She exhibits no distension and no mass. There is no tenderness.  Musculoskeletal: She exhibits no edema.  Lymphadenopathy:    She has no cervical adenopathy.  Neurological: She is alert and oriented to person, place, and time.  Skin: Skin is warm and dry.  Psychiatric: She has a normal mood and affect. Her behavior is normal.    BP 112/74 mmHg  Pulse 93  Temp(Src) 98.1 F (36.7 C) (Oral)  Ht 5' (1.524 m)  Wt 132 lb 4 oz (59.988 kg)  BMI 25.83 kg/m2  SpO2 96%  LMP 04/01/2016 Wt Readings from Last 3 Encounters:  05/02/16 132 lb 4 oz (59.988 kg)  04/26/16 132 lb 1.6 oz (59.92 kg)  02/01/16 130 lb 6 oz (59.138 kg)     No results found for: WBC, HGB, HCT, PLT, GLUCOSE, CHOL, TRIG, HDL, LDLDIRECT, LDLCALC, ALT, AST, NA, K, CL, CREATININE, BUN, CO2, TSH, PSA, INR, GLUF, HGBA1C, MICROALBUR  No results found for: TSH No results found for: WBC, HGB, HCT, MCV, PLT No results found for: NA, K, CHLORIDE, CO2, GLUCOSE, BUN, CREATININE, BILITOT, ALKPHOS, AST, ALT, PROT, ALBUMIN, CALCIUM, ANIONGAP, EGFR, GFR No results found for: CHOL No results found for: HDL No results found for: LDLCALC No results found for: TRIG No results found for: CHOLHDL No results found for: HGBA1C     Assessment & Plan:   Problem List Items Addressed This Visit    Preventative health care - Primary   Relevant Medications   ranitidine (ZANTAC) 300 MG tablet   methylPREDNISolone acetate (DEPO-MEDROL) injection 40 mg (Completed)   Other Relevant Orders   CBC w/Diff   TSH   T4, free   Comprehensive metabolic panel   Insomnia    Encouraged good sleep hygiene such as dark, quiet room. No blue/green glowing lights such as computer screens in bedroom. No alcohol or stimulants in evening. Cut down on caffeine as able. Regular exercise is helpful but not just prior to bed time. Allowed refills on Ambien prn      GERD (gastroesophageal reflux disease)    Avoid offending foods, take probiotics.  Do not eat large meals in late evening and consider raising head of bed.       Relevant Medications   ranitidine (ZANTAC) 300 MG tablet   Cough    Persistent bronchitis. Started on Doxycycline and given shot of steroids. Encouraged increased rest and hydration, add probiotics, zinc such as Coldeze or Xicam. Treat fevers as needed      Relevant Medications   methylPREDNISolone acetate (DEPO-MEDROL) injection 40 mg (Completed)      I have discontinued Ms. Dawes's escitalopram, benzonatate, predniSONE, and methylPREDNISolone acetate. I am also having her start on ranitidine. Additionally, I am having her maintain her acetaminophen, zolpidem, VENTOLIN HFA, NON FORMULARY,  HYDROcodone-homatropine, and doxycycline. We administered methylPREDNISolone acetate.  Meds ordered this encounter  Medications  . ranitidine (ZANTAC) 300 MG tablet    Sig: Take 1 tablet (300 mg total) by mouth at bedtime.    Dispense:  30 tablet    Refill:  2  . DISCONTD: methylPREDNISolone acetate (DEPO-MEDROL) 40 MG/ML injection    Sig: Inject 1 mL (40 mg total) into the muscle once.    Dispense:  1 mL    Refill:  0  . methylPREDNISolone acetate (DEPO-MEDROL) injection 40 mg    Sig:      Penni Homans, MD

## 2016-05-02 NOTE — Progress Notes (Signed)
Pre visit review using our clinic review tool, if applicable. No additional management support is needed unless otherwise documented below in the visit note. 

## 2016-05-07 DIAGNOSIS — R05 Cough: Secondary | ICD-10-CM | POA: Insufficient documentation

## 2016-05-07 DIAGNOSIS — R059 Cough, unspecified: Secondary | ICD-10-CM | POA: Insufficient documentation

## 2016-05-07 NOTE — Assessment & Plan Note (Signed)
Persistent bronchitis. Started on Doxycycline and given shot of steroids. Encouraged increased rest and hydration, add probiotics, zinc such as Coldeze or Xicam. Treat fevers as needed

## 2016-05-07 NOTE — Assessment & Plan Note (Signed)
Encouraged good sleep hygiene such as dark, quiet room. No blue/green glowing lights such as computer screens in bedroom. No alcohol or stimulants in evening. Cut down on caffeine as able. Regular exercise is helpful but not just prior to bed time. Allowed refills on Ambien prn

## 2016-05-07 NOTE — Assessment & Plan Note (Signed)
Avoid offending foods, take probiotics. Do not eat large meals in late evening and consider raising head of bed.  

## 2016-05-11 ENCOUNTER — Other Ambulatory Visit (INDEPENDENT_AMBULATORY_CARE_PROVIDER_SITE_OTHER): Payer: BLUE CROSS/BLUE SHIELD

## 2016-05-11 ENCOUNTER — Encounter: Payer: Self-pay | Admitting: Medical

## 2016-05-11 ENCOUNTER — Ambulatory Visit (INDEPENDENT_AMBULATORY_CARE_PROVIDER_SITE_OTHER): Payer: BLUE CROSS/BLUE SHIELD | Admitting: Medical

## 2016-05-11 VITALS — BP 114/72 | HR 90 | Temp 98.1°F | Ht 60.0 in | Wt 130.0 lb

## 2016-05-11 DIAGNOSIS — Z0000 Encounter for general adult medical examination without abnormal findings: Secondary | ICD-10-CM

## 2016-05-11 DIAGNOSIS — J309 Allergic rhinitis, unspecified: Secondary | ICD-10-CM

## 2016-05-11 DIAGNOSIS — J0100 Acute maxillary sinusitis, unspecified: Secondary | ICD-10-CM

## 2016-05-11 DIAGNOSIS — R05 Cough: Secondary | ICD-10-CM | POA: Diagnosis not present

## 2016-05-11 DIAGNOSIS — R059 Cough, unspecified: Secondary | ICD-10-CM

## 2016-05-11 DIAGNOSIS — J209 Acute bronchitis, unspecified: Secondary | ICD-10-CM

## 2016-05-11 LAB — CBC WITH DIFFERENTIAL/PLATELET
BASOS ABS: 0.1 10*3/uL (ref 0.0–0.1)
BASOS PCT: 0.6 % (ref 0.0–3.0)
EOS ABS: 0.7 10*3/uL (ref 0.0–0.7)
Eosinophils Relative: 5.6 % — ABNORMAL HIGH (ref 0.0–5.0)
HEMATOCRIT: 38.3 % (ref 36.0–46.0)
HEMOGLOBIN: 12.5 g/dL (ref 12.0–15.0)
LYMPHS PCT: 24.6 % (ref 12.0–46.0)
Lymphs Abs: 3.1 10*3/uL (ref 0.7–4.0)
MCHC: 32.6 g/dL (ref 30.0–36.0)
MCV: 82.7 fl (ref 78.0–100.0)
MONOS PCT: 5.5 % (ref 3.0–12.0)
Monocytes Absolute: 0.7 10*3/uL (ref 0.1–1.0)
NEUTROS ABS: 8.1 10*3/uL — AB (ref 1.4–7.7)
Neutrophils Relative %: 63.7 % (ref 43.0–77.0)
PLATELETS: 398 10*3/uL (ref 150.0–400.0)
RBC: 4.63 Mil/uL (ref 3.87–5.11)
RDW: 17.6 % — ABNORMAL HIGH (ref 11.5–15.5)
WBC: 12.7 10*3/uL — AB (ref 4.0–10.5)

## 2016-05-11 LAB — COMPREHENSIVE METABOLIC PANEL
ALBUMIN: 4.1 g/dL (ref 3.5–5.2)
ALK PHOS: 58 U/L (ref 39–117)
ALT: 16 U/L (ref 0–35)
AST: 14 U/L (ref 0–37)
BILIRUBIN TOTAL: 0.3 mg/dL (ref 0.2–1.2)
BUN: 8 mg/dL (ref 6–23)
CO2: 27 mEq/L (ref 19–32)
CREATININE: 0.59 mg/dL (ref 0.40–1.20)
Calcium: 9.3 mg/dL (ref 8.4–10.5)
Chloride: 103 mEq/L (ref 96–112)
GFR: 114.81 mL/min (ref >60.00)
GLUCOSE: 111 mg/dL — AB (ref 70–99)
Potassium: 3.7 mEq/L (ref 3.5–5.1)
SODIUM: 137 meq/L (ref 135–145)
TOTAL PROTEIN: 7.1 g/dL (ref 6.0–8.3)

## 2016-05-11 LAB — T4, FREE: FREE T4: 0.85 ng/dL (ref 0.60–1.60)

## 2016-05-11 LAB — TSH: TSH: 1.21 u[IU]/mL (ref 0.35–4.50)

## 2016-05-11 MED ORDER — LEVOFLOXACIN 500 MG PO TABS: 500.0000 mg | ORAL_TABLET | Freq: Every day | ORAL | Status: DC

## 2016-05-11 MED ORDER — AZELASTINE HCL 0.1 % NA SOLN: 2.0000 | Freq: Two times a day (BID) | NASAL | Status: DC

## 2016-05-11 MED ORDER — HYDROCODONE-HOMATROPINE 5-1.5 MG/5ML PO SYRP: 5.0000 mL | ORAL_SOLUTION | Freq: Three times a day (TID) | ORAL | Status: DC | PRN

## 2016-05-11 NOTE — Patient Instructions (Addendum)
For maxillary sinus infection that is recurrent after taking doxycycline will rx levofloxin.  This antibiotic  can cover lungs/bronchitis  as well. But continue to decrease smoking and eventually stop.  For cough refill hycodan.  For allergy component rx astelin. Continue the prior allergy regmien.  Follow up in 7-10 days or as needed

## 2016-05-11 NOTE — Progress Notes (Addendum)
Subjective:    Patient ID: Monica Dillon, female    DOB: [birth year], 50 y.o.   MRN: [id number]  HPI  Pt in for recent cough and nasal congestion. She can feel pnd. Pt had some mild dc from medial canthus area left side(but now clear). Some sinus pressure. Cough worse at night.  Pt is smoker.   No fever, no chills or sweats.  Pt states this Sunday symptoms seemed to start/reoccur.  Pt is on zyrtec-d. Pt using flonase. No other nasal sprays. Last visit with Dr Abner Greenspan patient was given steroid injection.  Pt last antibiotic she was on was doxycycline.(when i saw her about 2 weeks ago).    Review of Systems  Constitutional: Negative for fever, chills and fatigue.  HENT: Positive for congestion, postnasal drip and sinus pressure. Negative for drooling, ear pain and sore throat.   Respiratory: Positive for cough. Negative for chest tightness, shortness of breath and wheezing.   Cardiovascular: Negative for chest pain and palpitations.  Gastrointestinal: Negative for abdominal pain.  Musculoskeletal: Negative for back pain.  Skin: Negative for rash.  Neurological: Negative for dizziness and headaches.  Hematological: Negative for adenopathy. Does not bruise/bleed easily.   Past Medical History  Diagnosis Date  . History of chicken pox   . Allergy   . GERD (gastroesophageal reflux disease)   . Anxiety and depression 02/06/2016     Social History   Social History  . Marital Status: Divorced    Spouse Name: N/A  . Number of Children: N/A  . Years of Education: 12   Occupational History  . [employer]    Social History Main Topics  . Smoking status: Current Every Day Smoker -- 1.00 packs/day for 30 years    Types: Cigarettes  . Smokeless tobacco: Not on file  . Alcohol Use: 0.0 oz/week    0 Standard drinks or equivalent per week     Comment: occasional  . Drug Use: No  . 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 Not on file   Social History Narrative    Past Surgical History  Procedure Laterality Date  . Tubal ligation  "about 20 yrs ago"    Family History  Problem Relation Age of Onset  . Diabetes Maternal Grandmother   . Drug abuse Father   . Cancer Father   . Cirrhosis Sister   . Alcohol abuse Sister   . Diverticulitis Mother   . Cancer Daughter     skin  . Aneurysm Maternal Aunt     brain    No Known Allergies  Current Outpatient Prescriptions on File Prior to Visit  Medication Sig Dispense Refill  . acetaminophen (TYLENOL) 325 MG tablet Take 650 mg by mouth every 6 (six) hours as needed for headache.    . NON FORMULARY 1 capsule 3 (three) times daily. Pt takes three [employer] brand cough med    . ranitidine (ZANTAC) 300 MG tablet Take 1 tablet (300 mg total) by mouth at bedtime. 30 tablet 2  . VENTOLIN HFA 108 (90 Base) MCG/ACT inhaler     . zolpidem (AMBIEN) 10 MG tablet Take 1 tablet (10 mg total) by mouth at bedtime as needed for sleep. 30 tablet 1   No current facility-administered medications on file prior to visit.    BP 114/72 mmHg  Pulse 90  Temp(Src)  98.1 F (36.7 C) (Oral)  Ht 5' (1.524 m)  Wt 130 lb (58.968 kg)  BMI 25.39 kg/m2  SpO2 98%  LMP 05/02/2016       Objective:   Physical Exam  General  Mental Status - Alert. General Appearance - Well groomed. Not in acute distress.  Skin Rashes- No Rashes.  HEENT Head- Normal. Ear Auditory Canal - Left- Normal. Right - Normal.Tympanic Membrane- Left- Normal. Right- Normal. Eye Sclera/Conjunctiva- Left- Normal. Right- Normal.(lids looks normal rt side and left side. No dc) Nose & Sinuses Nasal Mucosa- Left-  Boggy and Congested. Right-  Boggy and  Congested.Bilateral maxillary and frontal sinus pressure. Mouth & Throat Lips: Upper Lip- Normal: no dryness, cracking, pallor, cyanosis, or vesicular eruption. Lower Lip-Normal: no  dryness, cracking, pallor, cyanosis or vesicular eruption. Buccal Mucosa- Bilateral- No Aphthous ulcers. Oropharynx- No Discharge or Erythema. +pnd Tonsils: Characteristics- Bilateral- No Erythema or Congestion. Size/Enlargement- Bilateral- No enlargement. Discharge- bilateral-None.  Neck Neck- Supple. No Masses.   Chest and Lung Exam Auscultation: Breath Sounds:-Clear even and unlabored.  Cardiovascular Auscultation:Rythm- Regular, rate and rhythm. Murmurs & Other Heart Sounds:Ausculatation of the heart reveal- No Murmurs.  Lymphatic Head & Neck General Head & Neck Lymphatics: Bilateral: Description- No Localized lymphadenopathy.       Assessment & Plan:  For maxillary sinus infection that is recurrent after taking doxycycline will rx levofloxin.  This antibiotic can cover lungs/bronchitis as well. But continue to decrease smoking and eventually stop.  For cough refill hycodan.  For allergy component rx astelin. Continue the prior allergy regmien.  Follow up in 7-10 days or as needed  Claus Silvestro, Ramon DredgeEdward, VF CorporationPA-C

## 2016-05-11 NOTE — Progress Notes (Signed)
Pre visit review using our clinic review tool, if applicable. No additional management support is needed unless otherwise documented below in the visit note. 

## 2016-05-12 ENCOUNTER — Other Ambulatory Visit: Payer: Self-pay | Admitting: Family Medicine

## 2016-05-12 DIAGNOSIS — D72829 Elevated white blood cell count, unspecified: Secondary | ICD-10-CM

## 2016-05-16 ENCOUNTER — Encounter: Payer: Self-pay | Admitting: Family Medicine

## 2016-05-16 ENCOUNTER — Other Ambulatory Visit: Payer: Self-pay | Admitting: Family Medicine

## 2016-05-16 DIAGNOSIS — J329 Chronic sinusitis, unspecified: Secondary | ICD-10-CM

## 2016-05-23 ENCOUNTER — Encounter: Payer: Self-pay | Admitting: Family Medicine

## 2016-08-07 ENCOUNTER — Other Ambulatory Visit (INDEPENDENT_AMBULATORY_CARE_PROVIDER_SITE_OTHER): Payer: BLUE CROSS/BLUE SHIELD

## 2016-08-07 DIAGNOSIS — D72829 Elevated white blood cell count, unspecified: Secondary | ICD-10-CM

## 2016-08-07 LAB — CBC WITH DIFFERENTIAL/PLATELET
BASOS PCT: 0.5 % (ref 0.0–3.0)
Basophils Absolute: 0.1 10*3/uL (ref 0.0–0.1)
EOS ABS: 0.2 10*3/uL (ref 0.0–0.7)
Eosinophils Relative: 2.1 % (ref 0.0–5.0)
HCT: 35.6 % — ABNORMAL LOW (ref 36.0–46.0)
Hemoglobin: 11.8 g/dL — ABNORMAL LOW (ref 12.0–15.0)
Lymphocytes Relative: 26.1 % (ref 12.0–46.0)
Lymphs Abs: 2.8 10*3/uL (ref 0.7–4.0)
MCHC: 33.1 g/dL (ref 30.0–36.0)
MCV: 81.9 fl (ref 78.0–100.0)
MONO ABS: 0.8 10*3/uL (ref 0.1–1.0)
Monocytes Relative: 7.6 % (ref 3.0–12.0)
NEUTROS ABS: 6.7 10*3/uL (ref 1.4–7.7)
Neutrophils Relative %: 63.7 % (ref 43.0–77.0)
PLATELETS: 338 10*3/uL (ref 150.0–400.0)
RBC: 4.35 Mil/uL (ref 3.87–5.11)
RDW: 15.5 % (ref 11.5–15.5)
WBC: 10.6 10*3/uL — AB (ref 4.0–10.5)

## 2016-08-22 ENCOUNTER — Encounter: Payer: Self-pay | Admitting: Family Medicine

## 2016-08-22 ENCOUNTER — Encounter: Payer: BLUE CROSS/BLUE SHIELD | Admitting: Family Medicine

## 2016-08-22 NOTE — Telephone Encounter (Signed)
Pt has been scheduled for 10/12/16 at 3:00. No call back is needed.

## 2016-10-12 ENCOUNTER — Other Ambulatory Visit (HOSPITAL_COMMUNITY)
Admission: RE | Admit: 2016-10-12 | Discharge: 2016-10-12 | Disposition: A | Discharge status: Home / Self Care | Payer: BLUE CROSS/BLUE SHIELD | Source: Ambulatory Visit | Attending: Family Medicine | Admitting: Family Medicine

## 2016-10-12 ENCOUNTER — Encounter: Payer: Self-pay | Admitting: Family Medicine

## 2016-10-12 ENCOUNTER — Telehealth: Payer: Self-pay | Admitting: Family Medicine

## 2016-10-12 ENCOUNTER — Ambulatory Visit (INDEPENDENT_AMBULATORY_CARE_PROVIDER_SITE_OTHER): Payer: BLUE CROSS/BLUE SHIELD | Admitting: Family Medicine

## 2016-10-12 VITALS — BP 118/80 | HR 86 | Temp 98.5°F | Ht 60.0 in | Wt 129.2 lb

## 2016-10-12 DIAGNOSIS — Z01419 Encounter for gynecological examination (general) (routine) without abnormal findings: Secondary | ICD-10-CM | POA: Diagnosis present

## 2016-10-12 DIAGNOSIS — Z124 Encounter for screening for malignant neoplasm of cervix: Secondary | ICD-10-CM | POA: Insufficient documentation

## 2016-10-12 DIAGNOSIS — Z720 Tobacco use: Secondary | ICD-10-CM

## 2016-10-12 DIAGNOSIS — D72829 Elevated white blood cell count, unspecified: Secondary | ICD-10-CM | POA: Diagnosis not present

## 2016-10-12 DIAGNOSIS — R739 Hyperglycemia, unspecified: Secondary | ICD-10-CM | POA: Diagnosis not present

## 2016-10-12 DIAGNOSIS — Z87891 Personal history of nicotine dependence: Secondary | ICD-10-CM | POA: Insufficient documentation

## 2016-10-12 DIAGNOSIS — Z0000 Encounter for general adult medical examination without abnormal findings: Secondary | ICD-10-CM

## 2016-10-12 DIAGNOSIS — K219 Gastro-esophageal reflux disease without esophagitis: Secondary | ICD-10-CM

## 2016-10-12 HISTORY — DX: Tobacco use: Z72.0

## 2016-10-12 HISTORY — DX: Encounter for screening for malignant neoplasm of cervix: Z12.4

## 2016-10-12 MED ORDER — AZELASTINE HCL 0.1 % NA SOLN: 2.0000 | Freq: Two times a day (BID) | NASAL | 3 refills | Status: DC

## 2016-10-12 MED ORDER — BUPROPION HCL ER (XL) 150 MG PO TB24: 150.0000 mg | ORAL_TABLET | Freq: Every day | ORAL | 0 refills | Status: DC

## 2016-10-12 MED ORDER — BUPROPION HCL ER (XL) 300 MG PO TB24: 300.0000 mg | ORAL_TABLET | Freq: Every day | ORAL | 3 refills | Status: DC

## 2016-10-12 MED ORDER — RANITIDINE HCL 300 MG PO TABS: 300.0000 mg | ORAL_TABLET | Freq: Every day | ORAL | 2 refills | Status: DC

## 2016-10-12 NOTE — Patient Instructions (Signed)
Smoking Cessation, Tips for Success If you are ready to quit smoking, congratulations! You have chosen to help yourself be healthier. Cigarettes bring nicotine, tar, carbon monoxide, and other irritants into your body. Your lungs, heart, and blood vessels will be able to work better without these poisons. There are many different ways to quit smoking. Nicotine gum, nicotine patches, a nicotine inhaler, or nicotine nasal spray can help with physical craving. Hypnosis, support groups, and medicines help break the habit of smoking. WHAT THINGS CAN I DO TO MAKE QUITTING EASIER?  Here are some tips to help you quit for good:  Pick a date when you will quit smoking completely. Tell all of your friends and family about your plan to quit on that date.  Do not try to slowly cut down on the number of cigarettes you are smoking. Pick a quit date and quit smoking completely starting on that day.  Throw away all cigarettes.   Clean and remove all ashtrays from your home, work, and car.  On a card, write down your reasons for quitting. Carry the card with you and read it when you get the urge to smoke.  Cleanse your body of nicotine. Drink enough water and fluids to keep your urine clear or pale yellow. Do this after quitting to flush the nicotine from your body.  Learn to predict your moods. Do not let a bad situation be your excuse to have a cigarette. Some situations in your life might tempt you into wanting a cigarette.  Never have "just one" cigarette. It leads to wanting another and another. Remind yourself of your decision to quit.  Change habits associated with smoking. If you smoked while driving or when feeling stressed, try other activities to replace smoking. Stand up when drinking your coffee. Brush your teeth after eating. Sit in a different chair when you read the paper. Avoid alcohol while trying to quit, and try to drink fewer caffeinated beverages. Alcohol and caffeine may urge you to  smoke.  Avoid foods and drinks that can trigger a desire to smoke, such as sugary or spicy foods and alcohol.  Ask people who smoke not to smoke around you.  Have something planned to do right after eating or having a cup of coffee. For example, plan to take a walk or exercise.  Try a relaxation exercise to calm you down and decrease your stress. Remember, you may be tense and nervous for the first 2 weeks after you quit, but this will pass.  Find new activities to keep your hands busy. Play with a pen, coin, or rubber band. Doodle or draw things on paper.  Brush your teeth right after eating. This will help cut down on the craving for the taste of tobacco after meals. You can also try mouthwash.   Use oral substitutes in place of cigarettes. Try using lemon drops, carrots, cinnamon sticks, or chewing gum. Keep them handy so they are available when you have the urge to smoke.  When you have the urge to smoke, try deep breathing.  Designate your home as a nonsmoking area.  If you are a heavy smoker, ask your health care provider about a prescription for nicotine chewing gum. It can ease your withdrawal from nicotine.  Reward yourself. Set aside the cigarette money you save and buy yourself something nice.  Look for support from others. Join a support group or smoking cessation program. Ask someone at home or at work to help you with your plan   to quit smoking.  Always ask yourself, "Do I need this cigarette or is this just a reflex?" Tell yourself, "Today, I choose not to smoke," or "I do not want to smoke." You are reminding yourself of your decision to quit.  Do not replace cigarette smoking with electronic cigarettes (commonly called e-cigarettes). The safety of e-cigarettes is unknown, and some may contain harmful chemicals.  If you relapse, do not give up! Plan ahead and think about what you will do the next time you get the urge to smoke. HOW WILL I FEEL WHEN I QUIT SMOKING? You  may have symptoms of withdrawal because your body is used to nicotine (the addictive substance in cigarettes). You may crave cigarettes, be irritable, feel very hungry, cough often, get headaches, or have difficulty concentrating. The withdrawal symptoms are only temporary. They are strongest when you first quit but will go away within 10-14 days. When withdrawal symptoms occur, stay in control. Think about your reasons for quitting. Remind yourself that these are signs that your body is healing and getting used to being without cigarettes. Remember that withdrawal symptoms are easier to treat than the major diseases that smoking can cause.  Even after the withdrawal is over, expect periodic urges to smoke. However, these cravings are generally short lived and will go away whether you smoke or not. Do not smoke! WHAT RESOURCES ARE AVAILABLE TO HELP ME QUIT SMOKING? Your health care provider can direct you to community resources or hospitals for support, which may include:  Group support.  Education.  Hypnosis.  Therapy.   This information is not intended to replace advice given to you by your health care provider. Make sure you discuss any questions you have with your health care provider.   Document Released: 09/01/2004 Document Revised: 12/25/2014 Document Reviewed: 05/22/2013 Elsevier Interactive Patient Education 2016 Elsevier Inc.  

## 2016-10-12 NOTE — Assessment & Plan Note (Signed)
Pap today, no concerns on exam.  

## 2016-10-12 NOTE — Progress Notes (Signed)
Pre visit review using our clinic review tool, if applicable. No additional management support is needed unless otherwise documented below in the visit note. 

## 2016-10-12 NOTE — Telephone Encounter (Signed)
Patient is calling stating that she was prescribed buPROPion (WELLBUTRIN XL) 300 MG 24 hr tablet at her appt today. Her insurance will only cover the Wellbutrin SR. She would like to know if she could get this medication prescribed instead. Please advise.   [facility] [place] [address]   Phone: [phone]

## 2016-10-13 MED ORDER — BUPROPION HCL ER (SR) 150 MG PO TB12: 150.0000 mg | ORAL_TABLET | Freq: Two times a day (BID) | ORAL | 2 refills | Status: DC

## 2016-10-13 NOTE — Telephone Encounter (Signed)
We can send in Wellbutrin SR but she has to know that it is 150 mg po qd x 3 days then increase to bid, disp #60 with 2 rf and d/c XL

## 2016-10-13 NOTE — Telephone Encounter (Signed)
Called left message to call back Medication is pended.

## 2016-10-13 NOTE — Telephone Encounter (Signed)
Patient called back informed of medication instructions. Sent to pharmacy and informed of PCP instructions.

## 2016-10-15 DIAGNOSIS — D72829 Elevated white blood cell count, unspecified: Secondary | ICD-10-CM | POA: Insufficient documentation

## 2016-10-15 DIAGNOSIS — R739 Hyperglycemia, unspecified: Secondary | ICD-10-CM | POA: Insufficient documentation

## 2016-10-15 NOTE — Assessment & Plan Note (Signed)
Avoid offending foods, start probiotics. Do not eat large meals in late evening and consider raising head of bed. Consider adding a PPI

## 2016-10-15 NOTE — Assessment & Plan Note (Signed)
hgba1c acceptable, minimize simple carbs. Increase exercise as tolerated. Continue current meds 

## 2016-10-15 NOTE — Progress Notes (Signed)
Patient ID: Monica Dillon, female   DOB: March 20, 1966, 50 y.o.   MRN: [id number]   Subjective:    Patient ID: Monica Dillon, female    DOB: 1966-04-10, 50 y.o.   MRN: [id number]  Chief Complaint  Patient presents with  . Annual Exam    HPI Patient is in today for follow up. She is noting intermittent trouble with fatigue, nausea and sinus headaches. She notes left sided facial pressure radiating to ear and down to shoulder. No fevers or chills. Denies CP/palp/SOBfevers/GI or GU c/o. Taking meds as prescribed    Past Surgical History:  Procedure Laterality Date  . TUBAL LIGATION  "about 20 yrs ago"    Family History  Problem Relation Age of Onset  . Diabetes Maternal Grandmother   . Drug abuse Father   . Cancer Father   . Cirrhosis Sister   . Alcohol abuse Sister   . Diverticulitis Mother   . Cancer Daughter     skin  . Aneurysm Maternal Aunt     brain    Social History   Social History  . Marital status: Divorced    Spouse name: N/A  . Number of children: N/A  . Years of education: 15   Occupational History  . [employer]    Social History Main Topics  . Smoking status: Current Every Day Smoker    Packs/day: 1.00    Years: 30.00    Types: Cigarettes  . Smokeless tobacco: Not on file  . Alcohol use 0.0 oz/week     Comment: occasional  . Drug use: No  . 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 Not on file   Social History Narrative  . No narrative on file    Outpatient Medications Prior to Visit  Medication Sig Dispense Refill  . acetaminophen (TYLENOL) 325 MG tablet Take 650 mg by mouth every 6 (six) hours as needed for headache.    Marland Kitchen azelastine (ASTELIN) 0.1 % nasal spray Place 2 sprays into both nostrils 2 (two) times daily. Use in each nostril as directed 30 mL 3  . ranitidine (ZANTAC) 300 MG tablet Take 1 tablet (300 mg total) by  mouth at bedtime. 30 tablet 2  . VENTOLIN HFA 108 (90 Base) MCG/ACT inhaler     . HYDROcodone-homatropine (HYCODAN) 5-1.5 MG/5ML syrup Take 5 mLs by mouth every 8 (eight) hours as needed for cough. 120 mL 0  . levofloxacin (LEVAQUIN) 500 MG tablet Take 1 tablet (500 mg total) by mouth daily. 7 tablet 0  . NON FORMULARY 1 capsule 3 (three) times daily. Pt takes three Kristopher Oppenheim brand cough med    . zolpidem (AMBIEN) 10 MG tablet Take 1 tablet (10 mg total) by mouth at bedtime as needed for sleep. 30 tablet 1   No facility-administered medications prior to visit.     No Known Allergies  Review of Systems  Constitutional: Positive for malaise/fatigue. Negative for fever.  HENT: Negative for congestion.   Eyes: Negative for blurred vision.  Respiratory: Negative for shortness of breath.   Cardiovascular: Negative for chest pain, palpitations and leg swelling.  Gastrointestinal: Positive for nausea. Negative for abdominal pain and blood in stool.  Genitourinary: Negative for dysuria and frequency.  Musculoskeletal: Positive for joint pain. Negative for falls.  Skin: Negative for rash.  Neurological: Negative for dizziness, loss of consciousness and headaches.  Endo/Heme/Allergies: Negative for environmental allergies.  Psychiatric/Behavioral: Negative for depression. The patient is not nervous/anxious.        Objective:    Physical Exam  Constitutional: She is oriented to person, place, and time. She appears well-developed and well-nourished. No distress.  HENT:  Head: Normocephalic and atraumatic.  Nose: Nose normal.  Eyes: Right eye exhibits no discharge. Left eye exhibits no discharge.  Neck: Normal range of motion. Neck supple.  Cardiovascular: Normal rate and regular rhythm.   No murmur heard. Pulmonary/Chest: Effort normal and breath sounds normal.  Abdominal: Soft. Bowel sounds are normal. There is no tenderness.  Musculoskeletal: She exhibits no edema.  Neurological:  She is alert and oriented to person, place, and time.  Skin: Skin is warm and dry.  Psychiatric: She has a normal mood and affect.  Nursing note and vitals reviewed.   BP 118/80 (BP Location: Left Arm, Patient Position: Sitting, Cuff Size: Normal)   Pulse 86   Temp 98.5 F (36.9 C) (Oral)   Ht 5' (1.524 m)   Wt 129 lb 4 oz (58.6 kg)   SpO2 97%   BMI 25.24 kg/m  Wt Readings from Last 3 Encounters:  10/12/16 129 lb 4 oz (58.6 kg)  05/11/16 130 lb (59 kg)  05/02/16 132 lb 4 oz (60 kg)     Lab Results  Component Value Date   WBC 10.6 (H) 08/07/2016   HGB 11.8 (L) 08/07/2016   HCT 35.6 (L) 08/07/2016   PLT 338.0 08/07/2016   GLUCOSE 111 (H) 05/11/2016   ALT 16 05/11/2016   AST 14 05/11/2016   NA 137 05/11/2016   K 3.7 05/11/2016   CL 103 05/11/2016   CREATININE 0.59 05/11/2016   BUN 8 05/11/2016   CO2 27 05/11/2016   TSH 1.21 05/11/2016    Lab Results  Component Value Date   TSH 1.21 05/11/2016   Lab Results  Component Value Date   WBC 10.6 (H) 08/07/2016   HGB 11.8 (L) 08/07/2016   HCT 35.6 (L) 08/07/2016   MCV 81.9 08/07/2016   PLT 338.0 08/07/2016   Lab Results  Component Value Date   NA 137 05/11/2016   K 3.7 05/11/2016   CO2 27 05/11/2016   GLUCOSE 111 (H) 05/11/2016   BUN 8 05/11/2016   CREATININE 0.59 05/11/2016   BILITOT 0.3 05/11/2016   ALKPHOS 58 05/11/2016   AST 14 05/11/2016   ALT 16 05/11/2016   PROT 7.1 05/11/2016   ALBUMIN 4.1 05/11/2016   CALCIUM 9.3 05/11/2016   GFR 114.81 05/11/2016   No results found for: CHOL No results found for: HDL No results found for: LDLCALC No results found for: TRIG No results found for: CHOLHDL No results found for: HGBA1C     Assessment & Plan:   Problem List Items Addressed This Visit    Preventative health care   Relevant Medications   ranitidine (ZANTAC) 300 MG tablet   Other Relevant Orders   Lipid panel   GERD (gastroesophageal reflux disease)    Avoid offending foods, start  probiotics. Do not eat large meals in late evening and consider raising head of bed. Consider adding a PPI      Relevant Medications   ranitidine (ZANTAC) 300 MG tablet   Cervical cancer screening    Pap today, no concerns on exam.       Relevant Orders   Cytology - PAP   Tobacco abuse disorder   Hyperglycemia    hgba1c acceptable, minimize  simple carbs. Increase exercise as tolerated. Continue current meds      Relevant Orders   Comp Met (CMET)   Leukocytosis - Primary    Improved since last blood draw. She will report any acute concerns, fevers etc.       Relevant Orders   CBC    Other Visit Diagnoses   None.     I have discontinued Ms. Catala's zolpidem, NON FORMULARY, levofloxacin, and HYDROcodone-homatropine. I am also having her start on buPROPion. Additionally, I am having her maintain her acetaminophen, VENTOLIN HFA, ranitidine, and azelastine.  Meds ordered this encounter  Medications  . buPROPion (WELLBUTRIN XL) 150 MG 24 hr tablet    Sig: Take 1 tablet (150 mg total) by mouth daily.    Dispense:  7 tablet    Refill:  0  . DISCONTD: buPROPion (WELLBUTRIN XL) 300 MG 24 hr tablet    Sig: Take 1 tablet (300 mg total) by mouth daily.    Dispense:  30 tablet    Refill:  3  . ranitidine (ZANTAC) 300 MG tablet    Sig: Take 1 tablet (300 mg total) by mouth at bedtime.    Dispense:  30 tablet    Refill:  2  . azelastine (ASTELIN) 0.1 % nasal spray    Sig: Place 2 sprays into both nostrils 2 (two) times daily. Use in each nostril as directed    Dispense:  30 mL    Refill:  3     Penni Homans, MD

## 2016-10-15 NOTE — Assessment & Plan Note (Signed)
Improved since last blood draw. She will report any acute concerns, fevers etc.

## 2016-10-16 LAB — CYTOLOGY - PAP: DIAGNOSIS: NEGATIVE

## 2016-10-23 ENCOUNTER — Encounter: Payer: Self-pay | Admitting: Family Medicine

## 2017-01-08 ENCOUNTER — Encounter: Payer: Self-pay | Admitting: Family Medicine

## 2017-01-08 ENCOUNTER — Ambulatory Visit (INDEPENDENT_AMBULATORY_CARE_PROVIDER_SITE_OTHER): Payer: BLUE CROSS/BLUE SHIELD | Admitting: Family Medicine

## 2017-01-08 ENCOUNTER — Ambulatory Visit (HOSPITAL_BASED_OUTPATIENT_CLINIC_OR_DEPARTMENT_OTHER)
Admission: RE | Admit: 2017-01-08 | Discharge: 2017-01-08 | Disposition: A | Discharge status: Home / Self Care | Payer: BLUE CROSS/BLUE SHIELD | Source: Ambulatory Visit | Attending: Family Medicine | Admitting: Family Medicine

## 2017-01-08 VITALS — BP 116/70 | HR 85 | Temp 98.0°F | Wt 133.2 lb

## 2017-01-08 DIAGNOSIS — G540 Brachial plexus disorders: Secondary | ICD-10-CM

## 2017-01-08 DIAGNOSIS — M25519 Pain in unspecified shoulder: Secondary | ICD-10-CM

## 2017-01-08 DIAGNOSIS — E782 Mixed hyperlipidemia: Secondary | ICD-10-CM

## 2017-01-08 DIAGNOSIS — Z720 Tobacco use: Secondary | ICD-10-CM

## 2017-01-08 DIAGNOSIS — E785 Hyperlipidemia, unspecified: Secondary | ICD-10-CM | POA: Insufficient documentation

## 2017-01-08 DIAGNOSIS — D72829 Elevated white blood cell count, unspecified: Secondary | ICD-10-CM | POA: Diagnosis not present

## 2017-01-08 DIAGNOSIS — M25511 Pain in right shoulder: Secondary | ICD-10-CM | POA: Insufficient documentation

## 2017-01-08 DIAGNOSIS — Z0000 Encounter for general adult medical examination without abnormal findings: Secondary | ICD-10-CM | POA: Diagnosis not present

## 2017-01-08 DIAGNOSIS — R739 Hyperglycemia, unspecified: Secondary | ICD-10-CM | POA: Diagnosis not present

## 2017-01-08 HISTORY — DX: Hyperlipidemia, unspecified: E78.5

## 2017-01-08 MED ORDER — VARENICLINE TARTRATE 1 MG PO TABS: 1.0000 mg | ORAL_TABLET | Freq: Two times a day (BID) | ORAL | 4 refills | Status: DC

## 2017-01-08 MED ORDER — CYCLOBENZAPRINE HCL 10 MG PO TABS: 10.0000 mg | ORAL_TABLET | Freq: Three times a day (TID) | ORAL | 0 refills | Status: DC | PRN

## 2017-01-08 MED ORDER — VARENICLINE TARTRATE 0.5 MG X 11 & 1 MG X 42 PO MISC: ORAL | 0 refills | Status: DC

## 2017-01-08 NOTE — Assessment & Plan Note (Signed)
Check labs prior to CPE in 6 mn

## 2017-01-08 NOTE — Assessment & Plan Note (Signed)
Encouraged complete cessation. Discussed need to quit as relates to risk of numerous cancers, cardiac and pulmonary disease as well as neurologic complications. Counseled for greater than 3 minutes. Chantix rx today

## 2017-01-08 NOTE — Patient Instructions (Addendum)
Apply moist warm heat three times a day for 15 minutes. Rotator Cuff Injury Rotator cuff injury is any type of injury to the set of muscles and tendons that make up the stabilizing unit of your shoulder. This unit holds the ball of your upper arm bone (humerus) in the socket of your shoulder blade (scapula). What are the causes? Injuries to your rotator cuff most commonly come from sports or activities that cause your arm to be moved repeatedly over your head. Examples of this include throwing, weight lifting, swimming, or racquet sports. Long lasting (chronic) irritation of your rotator cuff can cause soreness and swelling (inflammation), bursitis, and eventual damage to your tendons, such as a tear (rupture). What are the signs or symptoms? Acute rotator cuff tear:  Sudden tearing sensation followed by severe pain shooting from your upper shoulder down your arm toward your elbow.  Decreased range of motion of your shoulder because of pain and muscle spasm.  Severe pain.  Inability to raise your arm out to the side because of pain and loss of muscle power (large tears). Chronic rotator cuff tear:  Pain that usually is worse at night and may interfere with sleep.  Gradual weakness and decreased shoulder motion as the pain worsens.  Decreased range of motion. Rotator cuff tendinitis:  Deep ache in your shoulder and the outside upper arm over your shoulder.  Pain that comes on gradually and becomes worse when lifting your arm to the side or turning it inward. How is this diagnosed? Rotator cuff injury is diagnosed through a medical history, physical exam, and imaging exam. The medical history helps determine the type of rotator cuff injury. Your health care provider will look at your injured shoulder, feel the injured area, and ask you to move your shoulder in different positions. X-ray exams typically are done to rule out other causes of shoulder pain, such as fractures. MRI is the exam of  choice for the most severe shoulder injuries because the images show muscles and tendons. How is this treated? Chronic tear:  Medicine for pain, such as acetaminophen or ibuprofen.  Physical therapy and range-of-motion exercises may be helpful in maintaining shoulder function and strength.  Steroid injections into your shoulder joint.  Surgical repair of the rotator cuff if the injury does not heal with noninvasive treatment. Acute tear:  Anti-inflammatory medicines such as ibuprofen and naproxen to help reduce pain and swelling.  A sling to help support your arm and rest your rotator cuff muscles. Long-term use of a sling is not advised. It may cause significant stiffening of the shoulder joint.  Surgery may be considered within a few weeks, especially in younger, active people, to return the shoulder to full function.  Indications for surgical treatment include the following:  Age younger than 60 years.  Rotator cuff tears that are complete.  Physical therapy, rest, and anti-inflammatory medicines have been used for 6-8 weeks, with no improvement.  Employment or sporting activity that requires constant shoulder use. Tendinitis:  Anti-inflammatory medicines such as ibuprofen and naproxen to help reduce pain and swelling.  A sling to help support your arm and rest your rotator cuff muscles. Long-term use of a sling is not advised. It may cause significant stiffening of the shoulder joint.  Severe tendinitis may require:  Steroid injections into your shoulder joint.  Physical therapy.  Surgery. Follow these instructions at home:  Apply ice to your injury:  Put ice in a plastic bag.  Place a towel between  your skin and the bag.  Leave the ice on for 20 minutes, 2-3 times a day.  If you have a shoulder immobilizer (sling and straps), wear it until told otherwise by your health care provider.  You may want to sleep on several pillows or in a recliner at night to  lessen swelling and pain.  Only take over-the-counter or prescription medicines for pain, discomfort, or fever as directed by your health care provider.  Do simple hand squeezing exercises with a soft rubber ball to decrease hand swelling. Contact a health care provider if:  Your shoulder pain increases, or new pain or numbness develops in your arm, hand, or fingers.  Your hand or fingers are colder than your other hand. Get help right away if:  Your arm, hand, or fingers are numb or tingling.  Your arm, hand, or fingers are increasingly swollen and painful, or they turn white or blue. This information is not intended to replace advice given to you by your health care provider. Make sure you discuss any questions you have with your health care provider. Document Released: 12/01/2000 Document Revised: 05/11/2016 Document Reviewed: 07/16/2013 Elsevier Interactive Patient Education  2017 [employer].

## 2017-01-08 NOTE — Assessment & Plan Note (Signed)
Repeat CBC with next visit. 

## 2017-01-08 NOTE — Progress Notes (Signed)
Subjective:    Patient ID: Monica Dillon, female    DOB: 22-Feb-1966, 51 y.o.   MRN: [id number]  Chief Complaint  Patient presents with  . Shoulder Pain    R shoulder pain since Friday, 01/05/2017. Burning, radiating pain down R arm  . Rash    R shoulder.    HPI Patient is in today for acute shoulder pain. Has been experiencing pain in right shoulder pain that feels as if it burns and radiates. Also has a rash on the top of her right shoulder. Rash is not painful or pruritic, not spreading. The pain in shoulder radiates from occiput near ear on right to top of shoulder. No injury or fall noted. No radicular symptoms to hadn although sometimes pain is noted into bicep. She also requests help with smoking cessation. Wellbutrin was not helpful and she would like to try Chantix. Denies CP/palp/SOB/HA/congestion/fevers/GI or GU c/o. Taking meds as prescribed    Past Surgical History:  Procedure Laterality Date  . TUBAL LIGATION  "about 20 yrs ago"    Family History  Problem Relation Age of Onset  . Diabetes Maternal Grandmother   . Drug abuse Father   . Cancer Father   . Cirrhosis Sister   . Alcohol abuse Sister   . Diverticulitis Mother   . Cancer Daughter     skin  . Aneurysm Maternal Aunt     brain    Social History   Social History  . Marital status: Divorced    Spouse name: N/A  . Number of children: N/A  . Years of education: 5012   Occupational History  . [employer]    Social History Main Topics  . Smoking status: Current Every Day Smoker    Packs/day: 1.00    Years: 30.00    Types: Cigarettes  . Smokeless tobacco: Never Used  . Alcohol use 0.0 oz/week     Comment: occasional  . Drug use: No  . 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 Not on file   Social History Narrative  . No narrative on file    Outpatient Medications  Prior to Visit  Medication Sig Dispense Refill  . acetaminophen (TYLENOL) 325 MG tablet Take 650 mg by mouth every 6 (six) hours as needed for headache.    Marland Kitchen. azelastine (ASTELIN) 0.1 % nasal spray Place 2 sprays into both nostrils 2 (two) times daily. Use in each nostril as directed 30 mL 3  . ranitidine (ZANTAC) 300 MG tablet Take 1 tablet (300 mg total) by mouth at bedtime. 30 tablet 2  . VENTOLIN HFA 108 (90 Base) MCG/ACT inhaler     . buPROPion (WELLBUTRIN SR) 150 MG 12 hr tablet Take 1 tablet (150 mg total) by mouth 2 (two) times daily. (Patient not taking: Reported on 01/08/2017) 60 tablet 2  . buPROPion (WELLBUTRIN XL) 150 MG 24 hr tablet Take 1 tablet (150 mg total) by mouth daily. (Patient not taking: Reported on 01/08/2017) 7 tablet 0   No facility-administered medications prior to visit.     No Known Allergies  Review of Systems  Constitutional: Negative for fever and malaise/fatigue.  HENT: Negative for congestion.   Eyes: Negative for blurred vision.  Respiratory: Negative for cough and shortness of breath.   Cardiovascular: Negative for chest pain, palpitations and leg swelling.  Gastrointestinal: Negative for vomiting.  Musculoskeletal: Positive for joint pain and myalgias. Negative for back pain.       Right shoulder pain. Radiating pain.  Skin: Positive for rash.       On top of right shoulder.  Neurological: Negative for loss of consciousness and headaches.       Objective:    Physical Exam  Constitutional: She is oriented to person, place, and time. She appears well-developed and well-nourished. No distress.  HENT:  Head: Normocephalic and atraumatic.  Eyes: Conjunctivae are normal.  Neck: Normal range of motion. No thyromegaly present.  Cardiovascular: Normal rate and regular rhythm.   Pulmonary/Chest: Effort normal and breath sounds normal. She has no wheezes.  Abdominal: Soft. Bowel sounds are normal. There is no tenderness.  Musculoskeletal: She exhibits  tenderness. She exhibits no edema or deformity.  Right SCM spasm and pain with palp  Neurological: She is alert and oriented to person, place, and time.  Skin: Skin is warm and dry. Rash noted. She is not diaphoretic.  Scattered, small, 1-2 mm erythematous maculopapular lesions on chest and upper back.   Psychiatric: She has a normal mood and affect.    BP 116/70 (BP Location: Left Arm, Patient Position: Sitting, Cuff Size: Normal)   Pulse 85   Temp 98 F (36.7 C) (Oral)   Wt 133 lb 3.2 oz (60.4 kg)   SpO2 97% Comment: RA  BMI 26.01 kg/m  Wt Readings from Last 3 Encounters:  01/08/17 133 lb 3.2 oz (60.4 kg)  10/12/16 129 lb 4 oz (58.6 kg)  05/11/16 130 lb (59 kg)     Lab Results  Component Value Date   WBC 10.6 (H) 08/07/2016   HGB 11.8 (L) 08/07/2016   HCT 35.6 (L) 08/07/2016   PLT 338.0 08/07/2016   GLUCOSE 111 (H) 05/11/2016   ALT 16 05/11/2016   AST 14 05/11/2016   NA 137 05/11/2016   K 3.7 05/11/2016   CL 103 05/11/2016   CREATININE 0.59 05/11/2016   BUN 8 05/11/2016   CO2 27 05/11/2016   TSH 1.21 05/11/2016    Lab Results  Component Value Date   TSH 1.21 05/11/2016   Lab Results  Component Value Date   WBC 10.6 (H) 08/07/2016   HGB 11.8 (L) 08/07/2016   HCT 35.6 (L) 08/07/2016   MCV 81.9 08/07/2016   PLT 338.0 08/07/2016   Lab Results  Component Value Date   NA 137 05/11/2016   K 3.7 05/11/2016   CO2 27 05/11/2016   GLUCOSE 111 (H) 05/11/2016   BUN 8 05/11/2016   CREATININE 0.59 05/11/2016   BILITOT 0.3 05/11/2016   ALKPHOS 58 05/11/2016   AST 14 05/11/2016   ALT 16 05/11/2016   PROT 7.1 05/11/2016   ALBUMIN 4.1 05/11/2016   CALCIUM 9.3 05/11/2016   GFR 114.81 05/11/2016   No results found for: CHOL No results found for: HDL No results found for: LDLCALC No results found for: TRIG No results found for: CHOLHDL No results found for: ZOXW9U    I acted as a Neurosurgeon for Dr. Abner Greenspan. Diamond Nickel, RMA  Assessment & Plan:   Problem List Items  Addressed This Visit    Preventative health care    Check labs prior to CPE in 6 mn      Relevant Orders   CBC   Comprehensive metabolic panel   Lipid panel   TSH   Tobacco abuse disorder    Encouraged complete cessation. Discussed need to quit as relates to risk  of numerous cancers, cardiac and pulmonary disease as well as neurologic complications. Counseled for greater than 3 minutes. Chantix rx today      Hyperglycemia - Primary    minimize simple carbs. Increase exercise as tolerated      Relevant Orders   Hemoglobin A1c   Leukocytosis    Repeat CBC with next visit      TOS (thoracic outlet syndrome)    Encouraged moist heat and gentle stretching as tolerated. May try NSAIDs and prescription meds as directed and report if symptoms worsen or seek immediate care. Flexeril prn qhs      Relevant Medications   cyclobenzaprine (FLEXERIL) 10 MG tablet   varenicline (CHANTIX STARTING MONTH PAK) 0.5 MG X 11 & 1 MG X 42 tablet   varenicline (CHANTIX CONTINUING MONTH PAK) 1 MG tablet   Hyperlipidemia   Relevant Orders   Lipid panel    Other Visit Diagnoses    Arthralgia of shoulder, unspecified laterality       Relevant Orders   DG Shoulder Right (Completed)      I have discontinued Ms. Heslin's buPROPion and buPROPion. I am also having her start on cyclobenzaprine, varenicline, and varenicline. Additionally, I am having her maintain her acetaminophen, VENTOLIN HFA, ranitidine, and azelastine.  Meds ordered this encounter  Medications  . cyclobenzaprine (FLEXERIL) 10 MG tablet    Sig: Take 1 tablet (10 mg total) by mouth 3 (three) times daily as needed for muscle spasms.    Dispense:  30 tablet    Refill:  0  . varenicline (CHANTIX STARTING MONTH PAK) 0.5 MG X 11 & 1 MG X 42 tablet    Sig: Take one 0.5 mg tablet by mouth once daily for 3 days, then increase to one 0.5 mg tablet twice daily for 4 days, then increase to one 1 mg tablet twice daily.    Dispense:  53 tablet      Refill:  0  . varenicline (CHANTIX CONTINUING MONTH PAK) 1 MG tablet    Sig: Take 1 tablet (1 mg total) by mouth 2 (two) times daily.    Dispense:  30 tablet    Refill:  4    To start once Starter Pack is complete    CMA served as scribe during this visit. History, Physical and Plan performed by medical provider. Documentation and orders reviewed and attested to.  Danise Edge, MD

## 2017-01-08 NOTE — Progress Notes (Signed)
Patient ID: Ramonita Labonya Eye, female   DOB: 12-28-1965, 51 y.o.   MRN: [id number]

## 2017-01-08 NOTE — Assessment & Plan Note (Signed)
Encouraged moist heat and gentle stretching as tolerated. May try NSAIDs and prescription meds as directed and report if symptoms worsen or seek immediate care. Flexeril prn qhs

## 2017-01-08 NOTE — Progress Notes (Signed)
Pre visit review using our clinic review tool, if applicable. No additional management support is needed unless otherwise documented below in the visit note. 

## 2017-01-08 NOTE — Assessment & Plan Note (Signed)
minimize simple carbs. Increase exercise as tolerated.  

## 2017-01-12 ENCOUNTER — Encounter: Payer: Self-pay | Admitting: Family Medicine

## 2017-01-12 ENCOUNTER — Ambulatory Visit (INDEPENDENT_AMBULATORY_CARE_PROVIDER_SITE_OTHER): Payer: BLUE CROSS/BLUE SHIELD | Admitting: Family Medicine

## 2017-01-12 DIAGNOSIS — E782 Mixed hyperlipidemia: Secondary | ICD-10-CM | POA: Diagnosis not present

## 2017-01-12 DIAGNOSIS — G540 Brachial plexus disorders: Secondary | ICD-10-CM | POA: Diagnosis not present

## 2017-01-12 DIAGNOSIS — R739 Hyperglycemia, unspecified: Secondary | ICD-10-CM | POA: Diagnosis not present

## 2017-01-12 DIAGNOSIS — Z720 Tobacco use: Secondary | ICD-10-CM

## 2017-01-12 DIAGNOSIS — B9789 Other viral agents as the cause of diseases classified elsewhere: Secondary | ICD-10-CM

## 2017-01-12 DIAGNOSIS — J988 Other specified respiratory disorders: Secondary | ICD-10-CM

## 2017-01-12 MED ORDER — HYDROCODONE-HOMATROPINE 5-1.5 MG/5ML PO SYRP: 5.0000 mL | ORAL_SOLUTION | Freq: Two times a day (BID) | ORAL | 0 refills | Status: DC | PRN

## 2017-01-12 MED FILL — HYDROCODONE-HOMATROPINE SYR: 5-1.5 | 12 days supply | Qty: 120 | Fill #0

## 2017-01-12 NOTE — Patient Instructions (Addendum)
Mucinex plain 2 times daily Elderberry twice daily Aged [employer] Garlic twice daily Vitamin C 500-1000mg  daily 64 ounces of water Probiotic "NOW" Soup  ZINC Steps to Quit Smoking Smoking tobacco can be bad for your health. It can also affect almost every organ in your body. Smoking puts you and people around you at risk for many serious long-lasting (chronic) diseases. Quitting smoking is hard, but it is one of the best things that you can do for your health. It is never too late to quit. What are the benefits of quitting smoking? When you quit smoking, you lower your risk for getting serious diseases and conditions. They can include:  Lung cancer or lung disease.  Heart disease.  Stroke.  Heart attack.  Not being able to have children (infertility).  Weak bones (osteoporosis) and broken bones (fractures). If you have coughing, wheezing, and shortness of breath, those symptoms may get better when you quit. You may also get sick less often. If you are pregnant, quitting smoking can help to lower your chances of having a baby of low birth weight. What can I do to help me quit smoking? Talk with your doctor about what can help you quit smoking. Some things you can do (strategies) include:  Quitting smoking totally, instead of slowly cutting back how much you smoke over a period of time.  Going to in-person counseling. You are more likely to quit if you go to many counseling sessions.  Using resources and support systems, such as:  Online chats with a Veterinary surgeon.  Phone quitlines.  Printed Materials engineer.  Support groups or group counseling.  Text messaging programs.  Mobile phone apps or applications.  Taking medicines. Some of these medicines may have nicotine in them. If you are pregnant or breastfeeding, do not take any medicines to quit smoking unless your doctor says it is okay. Talk with your doctor about counseling or other things that can help you. Talk  with your doctor about using more than one strategy at the same time, such as taking medicines while you are also going to in-person counseling. This can help make quitting easier. What things can I do to make it easier to quit? Quitting smoking might feel very hard at first, but there is a lot that you can do to make it easier. Take these steps:  Talk to your family and friends. Ask them to support and encourage you.  Call phone quitlines, reach out to support groups, or work with a Veterinary surgeon.  Ask people who smoke to not smoke around you.  Avoid places that make you want (trigger) to smoke, such as:  Bars.  Parties.  Smoke-break areas at work.  Spend time with people who do not smoke.  Lower the stress in your life. Stress can make you want to smoke. Try these things to help your stress:  Getting regular exercise.  Deep-breathing exercises.  Yoga.  Meditating.  Doing a body scan. To do this, close your eyes, focus on one area of your body at a time from head to toe, and notice which parts of your body are tense. Try to relax the muscles in those areas.  Download or buy apps on your mobile phone or tablet that can help you stick to your quit plan. There are many free apps, such as QuitGuide from the [employer] developer for Disease Control and Prevention). You can find more support from smokefree.gov and other websites. This information is not intended to replace advice given  to you by your health care provider. Make sure you discuss any questions you have with your health care provider. Document Released: 09/30/2009 Document Revised: 08/01/2016 Document Reviewed: 04/20/2015 Elsevier Interactive Patient Education  2017 [employer]

## 2017-01-12 NOTE — Progress Notes (Signed)
Pre visit review using our clinic review tool, if applicable. No additional management support is needed unless otherwise documented below in the visit note. 

## 2017-01-12 NOTE — Progress Notes (Signed)
Subjective:    Patient ID: Monica Dillon, female    DOB: 1966-01-22, 51 y.o.   MRN: [id number]  Chief Complaint  Patient presents with  . Follow-up  I acted as a Neurosurgeon for Dr. Abner Greenspan. Princess, RMA   HPI Patient is in today for follow up for hyperlipidemia and other chronic  Issues and she has noted some head congestion and cough in the past couple of days. Has not been bad enough to take any meds for this. Her shoulder pain has improved from earlier in the week. She has been using topical treatments and the muscle relaxant has helped some but does cause some fatigue. Has not started her Chantix yet because work is requiring her to go through a class before they will pay for it. Denies CP/palp/SOB/HA/fevers/GI or GU c/o. Taking meds as prescribed    Past Surgical History:  Procedure Laterality Date  . TUBAL LIGATION  "about 20 yrs ago"    Family History  Problem Relation Age of Onset  . Diabetes Maternal Grandmother   . Drug abuse Father   . Cancer Father   . Cirrhosis Sister   . Alcohol abuse Sister   . Diverticulitis Mother   . Cancer Daughter     skin  . Aneurysm Maternal Aunt     brain    Social History   Social History  . Marital status: Divorced    Spouse name: N/A  . Number of children: N/A  . Years of education: 47   Occupational History  . [employer]    Social History Main Topics  . Smoking status: Current Every Day Smoker    Packs/day: 1.00    Years: 30.00    Types: Cigarettes  . Smokeless tobacco: Never Used  . Alcohol use 0.0 oz/week     Comment: occasional  . Drug use: No  . 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 Not on file   Social History Narrative  . No narrative on file    Outpatient Medications Prior to Visit  Medication Sig Dispense Refill  . acetaminophen (TYLENOL) 325 MG tablet Take 650 mg by mouth  every 6 (six) hours as needed for headache.    Marland Kitchen azelastine (ASTELIN) 0.1 % nasal spray Place 2 sprays into both nostrils 2 (two) times daily. Use in each nostril as directed 30 mL 3  . cyclobenzaprine (FLEXERIL) 10 MG tablet Take 1 tablet (10 mg total) by mouth 3 (three) times daily as needed for muscle spasms. 30 tablet 0  . ranitidine (ZANTAC) 300 MG tablet Take 1 tablet (300 mg total) by mouth at bedtime. 30 tablet 2  . varenicline (CHANTIX CONTINUING MONTH PAK) 1 MG tablet Take 1 tablet (1 mg total) by mouth 2 (two) times daily. 30 tablet 4  . varenicline (CHANTIX STARTING MONTH PAK) 0.5 MG X 11 & 1 MG X 42 tablet Take one 0.5 mg tablet by mouth once daily for 3 days, then increase to one 0.5 mg tablet twice daily for 4 days, then increase to one 1 mg tablet twice daily. 53 tablet 0  . VENTOLIN HFA 108 (90 Base) MCG/ACT inhaler      No facility-administered medications prior to visit.     No Known Allergies  Review of Systems  Constitutional: Negative for fever and malaise/fatigue.  HENT: Positive for congestion.   Eyes: Negative  for blurred vision.  Respiratory: Positive for cough. Negative for shortness of breath.   Cardiovascular: Negative for chest pain, palpitations and leg swelling.  Gastrointestinal: Negative for vomiting.  Musculoskeletal: Positive for joint pain. Negative for back pain.  Skin: Negative for rash.  Neurological: Negative for loss of consciousness and headaches.       Objective:    Physical Exam  Constitutional: She is oriented to person, place, and time. She appears well-developed and well-nourished. No distress.  HENT:  Head: Normocephalic and atraumatic.  Eyes: Conjunctivae are normal.  Neck: Normal range of motion. No thyromegaly present.  Cardiovascular: Normal rate and regular rhythm.   Pulmonary/Chest: Effort normal and breath sounds normal. She has no wheezes.  Abdominal: Soft. Bowel sounds are normal. There is no tenderness.  Musculoskeletal:  Normal range of motion. She exhibits no edema or deformity.  Neurological: She is alert and oriented to person, place, and time.  Skin: Skin is warm and dry. She is not diaphoretic.  Psychiatric: She has a normal mood and affect.    BP 100/60 (BP Location: Left Arm, Patient Position: Sitting, Cuff Size: Normal)   Pulse 90   Temp 97.9 F (36.6 C) (Oral)   Wt 134 lb 6.4 oz (61 kg)   SpO2 97%   BMI 26.25 kg/m  Wt Readings from Last 3 Encounters:  01/12/17 134 lb 6.4 oz (61 kg)  01/08/17 133 lb 3.2 oz (60.4 kg)  10/12/16 129 lb 4 oz (58.6 kg)     Lab Results  Component Value Date   WBC 10.6 (H) 08/07/2016   HGB 11.8 (L) 08/07/2016   HCT 35.6 (L) 08/07/2016   PLT 338.0 08/07/2016   GLUCOSE 111 (H) 05/11/2016   ALT 16 05/11/2016   AST 14 05/11/2016   NA 137 05/11/2016   K 3.7 05/11/2016   CL 103 05/11/2016   CREATININE 0.59 05/11/2016   BUN 8 05/11/2016   CO2 27 05/11/2016   TSH 1.21 05/11/2016    Lab Results  Component Value Date   TSH 1.21 05/11/2016   Lab Results  Component Value Date   WBC 10.6 (H) 08/07/2016   HGB 11.8 (L) 08/07/2016   HCT 35.6 (L) 08/07/2016   MCV 81.9 08/07/2016   PLT 338.0 08/07/2016   Lab Results  Component Value Date   NA 137 05/11/2016   K 3.7 05/11/2016   CO2 27 05/11/2016   GLUCOSE 111 (H) 05/11/2016   BUN 8 05/11/2016   CREATININE 0.59 05/11/2016   BILITOT 0.3 05/11/2016   ALKPHOS 58 05/11/2016   AST 14 05/11/2016   ALT 16 05/11/2016   PROT 7.1 05/11/2016   ALBUMIN 4.1 05/11/2016   CALCIUM 9.3 05/11/2016   GFR 114.81 05/11/2016   No results found for: CHOL No results found for: HDL No results found for: LDLCALC No results found for: TRIG No results found for: CHOLHDL No results found for: [id number]     Assessment & Plan:   Problem List Items Addressed This Visit    Tobacco abuse disorder    Has a prescription for Chantix and has to go through a class with work and they will pay for the rx after that.  Encouraged  complete cessation. Discussed need to quit as relates to risk of numerous cancers, cardiac and pulmonary disease as well as neurologic complications. Counseled for greater than 3 minutes      Hyperglycemia    minimize simple carbs. Increase exercise as tolerated.  TOS (thoracic outlet syndrome)    Right shoulder pain improving. Continue current treatment and report if symptoms worsen again.       Hyperlipidemia    Encouraged heart healthy diet, increase exercise, avoid trans fats, consider a krill oil cap daily      Viral respiratory illness    Mild and congestion has just started in past couple of days. Encouraged increased rest and hydration, add probiotics, zinc such as Coldeze or Xicam. Treat fevers as needed and given Hydromet to use qhs to help her rest         I am having Ms. Moynahan start on HYDROcodone-homatropine. I am also having her maintain her acetaminophen, VENTOLIN HFA, ranitidine, azelastine, cyclobenzaprine, varenicline, and varenicline.  Meds ordered this encounter  Medications  . HYDROcodone-homatropine (HYCODAN) 5-1.5 MG/5ML syrup    Sig: Take 5 mLs by mouth 2 (two) times daily as needed for cough.    Dispense:  120 mL    Refill:  0    CMA served as scribe during this visit. History, Physical and Plan performed by medical provider. Documentation and orders reviewed and attested to.  Danise Edge, MD

## 2017-01-14 ENCOUNTER — Encounter: Payer: Self-pay | Admitting: Family Medicine

## 2017-01-14 DIAGNOSIS — B9789 Other viral agents as the cause of diseases classified elsewhere: Secondary | ICD-10-CM | POA: Insufficient documentation

## 2017-01-14 DIAGNOSIS — J988 Other specified respiratory disorders: Secondary | ICD-10-CM

## 2017-01-14 HISTORY — DX: Other viral agents as the cause of diseases classified elsewhere: B97.89

## 2017-01-14 HISTORY — DX: Other specified respiratory disorders: J98.8

## 2017-01-14 NOTE — Assessment & Plan Note (Signed)
Right shoulder pain improving. Continue current treatment and report if symptoms worsen again.

## 2017-01-14 NOTE — Assessment & Plan Note (Signed)
Encouraged heart healthy diet, increase exercise, avoid trans fats, consider a krill oil cap daily 

## 2017-01-14 NOTE — Assessment & Plan Note (Addendum)
minimize simple carbs. Increase exercise as tolerated.  

## 2017-01-14 NOTE — Assessment & Plan Note (Signed)
Mild and congestion has just started in past couple of days. Encouraged increased rest and hydration, add probiotics, zinc such as Coldeze or Xicam. Treat fevers as needed and given Hydromet to use qhs to help her rest

## 2017-01-14 NOTE — Assessment & Plan Note (Addendum)
Has a prescription for Chantix and has to go through a class with work and they will pay for the rx after that.  Encouraged complete cessation. Discussed need to quit as relates to risk of numerous cancers, cardiac and pulmonary disease as well as neurologic complications. Counseled for greater than 3 minutes

## 2017-01-15 ENCOUNTER — Encounter: Payer: Self-pay | Admitting: Family Medicine

## 2017-01-15 ENCOUNTER — Ambulatory Visit (INDEPENDENT_AMBULATORY_CARE_PROVIDER_SITE_OTHER): Payer: BLUE CROSS/BLUE SHIELD | Admitting: Family Medicine

## 2017-01-15 VITALS — BP 110/82 | HR 102 | Temp 98.4°F | Ht 60.0 in | Wt 133.4 lb

## 2017-01-15 DIAGNOSIS — B9789 Other viral agents as the cause of diseases classified elsewhere: Secondary | ICD-10-CM

## 2017-01-15 DIAGNOSIS — H6991 Unspecified Eustachian tube disorder, right ear: Secondary | ICD-10-CM

## 2017-01-15 DIAGNOSIS — J069 Acute upper respiratory infection, unspecified: Secondary | ICD-10-CM

## 2017-01-15 DIAGNOSIS — H6981 Other specified disorders of Eustachian tube, right ear: Secondary | ICD-10-CM | POA: Diagnosis not present

## 2017-01-15 NOTE — Patient Instructions (Addendum)
For next 3 days, use Afrin. Don't use this more than 3 days.   Start using Flonase again. Remember to aim towards the same side eye! 2 sprays, each nostril once daily.  OK to use Benadryl at night.  If you start having pus drainage from nose, fevers, or shaking, let us know.  Keep up the great work with your efforts to stop smoking.

## 2017-01-15 NOTE — Progress Notes (Signed)
Pre visit review using our clinic review tool, if applicable. No additional management support is needed unless otherwise documented below in the visit note. 

## 2017-01-15 NOTE — Progress Notes (Signed)
Chief Complaint  Patient presents with  . Cough    Pt reports cough since thursday and has had cough syrup with no relief x2 days has headache and RT ear pain    Ramonita Labonya Heckmann here for URI complaints.  Duration: 4 days, R ear pain started yesterday Associated symptoms: sinus congestion, rhinorrhea, R ear pain, cough, sore throat Denies: sinus pain, itchy watery eyes, ear drainage, shortness of breath and myalgia, jaw pain Treatment to date: Mucinex Sick contacts: No  ROS:  Const: Denies fevers HEENT: As noted in HPI Lungs: No SOB  Past Medical History:  Diagnosis Date  . Allergy   . Anxiety and depression 02/06/2016  . GERD (gastroesophageal reflux disease)   . History of chicken pox   . Hyperlipidemia 01/08/2017  . Tobacco abuse disorder 10/12/2016  . Viral respiratory illness 01/14/2017   Family History  Problem Relation Age of Onset  . Diabetes Maternal Grandmother   . Drug abuse Father   . Cancer Father   . Cirrhosis Sister   . Alcohol abuse Sister   . Diverticulitis Mother   . Cancer Daughter     skin  . Aneurysm Maternal Aunt     brain    BP 110/82 (BP Location: Left Arm, Patient Position: Sitting, Cuff Size: Small)   Pulse (!) 102   Temp 98.4 F (36.9 C) (Oral)   Ht 5' (1.524 m)   Wt 133 lb 6.4 oz (60.5 kg)   LMP 01/08/2017   SpO2 96%   BMI 26.05 kg/m  General: Awake, alert, appears stated age HEENT: AT, [place], no external ear TTP, ears patent b/l Tm's mildly retracted, moreso on R than L, nares patent w rhinorrhea, pharynx pink and without exudates, MMM Neck: No masses or asymmetry Heart: RRR (HR approx 84 when I listened), no murmurs, no bruits Lungs: CTAB, no accessory muscle use; cough Psych: Age appropriate judgment and insight, normal mood and affect  Dysfunction of right eustachian tube  Viral URI with cough  Orders as above. Afrin for 3 days, start Flonase, Benadryl at night. No infection at this time. Continue to push fluids, practice good  hand hygiene, cover mouth when coughing. F/u prn. Pt voiced understanding and agreement to the plan.  Jilda Rocheicholas Paul HavreWendling, DO 01/15/17 8:59 AM

## 2017-03-11 ENCOUNTER — Encounter: Payer: Self-pay | Admitting: Family Medicine

## 2017-03-11 DIAGNOSIS — Z0000 Encounter for general adult medical examination without abnormal findings: Secondary | ICD-10-CM

## 2017-03-12 MED ORDER — RANITIDINE HCL 300 MG PO TABS: 300.0000 mg | ORAL_TABLET | Freq: Every day | ORAL | 1 refills | Status: DC

## 2017-03-29 ENCOUNTER — Other Ambulatory Visit: Payer: BLUE CROSS/BLUE SHIELD

## 2017-05-10 ENCOUNTER — Ambulatory Visit (INDEPENDENT_AMBULATORY_CARE_PROVIDER_SITE_OTHER): Payer: BLUE CROSS/BLUE SHIELD | Admitting: Family Medicine

## 2017-05-10 DIAGNOSIS — R05 Cough: Secondary | ICD-10-CM

## 2017-05-10 DIAGNOSIS — Z720 Tobacco use: Secondary | ICD-10-CM

## 2017-05-10 DIAGNOSIS — R059 Cough, unspecified: Secondary | ICD-10-CM

## 2017-05-10 DIAGNOSIS — K219 Gastro-esophageal reflux disease without esophagitis: Secondary | ICD-10-CM

## 2017-05-10 DIAGNOSIS — R739 Hyperglycemia, unspecified: Secondary | ICD-10-CM

## 2017-05-10 MED ORDER — AMOXICILLIN 500 MG PO CAPS: 500.0000 mg | ORAL_CAPSULE | Freq: Three times a day (TID) | ORAL | 0 refills | Status: DC

## 2017-05-10 MED ORDER — METHYLPREDNISOLONE 4 MG PO TABS: ORAL_TABLET | ORAL | 0 refills | Status: DC

## 2017-05-10 MED ORDER — ALBUTEROL SULFATE HFA 108 (90 BASE) MCG/ACT IN AERS: 1.0000 | INHALATION_SPRAY | RESPIRATORY_TRACT | 1 refills | Status: DC | PRN

## 2017-05-10 MED ORDER — HYDROCODONE-HOMATROPINE 5-1.5 MG/5ML PO SYRP: 5.0000 mL | ORAL_SOLUTION | Freq: Every evening | ORAL | 0 refills | Status: DC | PRN

## 2017-05-10 NOTE — Patient Instructions (Addendum)
Encouraged increased rest and hydration, add probiotics, zinc such as Coldeze or Xicam. Treat fevers as needed. Elderberry, aged or black garlic, vitamin c 500 to 1000 mg, Mucinex twice daily Tobacco Use Disorder Tobacco use disorder (TUD) is a mental disorder. It is the long-term use of tobacco in spite of related health problems or difficulty with normal life activities. Tobacco is most commonly smoked as cigarettes and less commonly as cigars or pipes. Smokeless chewing tobacco and snuff are also popular. People with TUD get a feeling of extreme pleasure (euphoria) from using tobacco and have a desire to use it again and again. Repeated use of tobacco can cause problems. The addictive effects of tobacco are due mainly tothe ingredient nicotine. Nicotine also causes a rush of adrenaline (epinephrine) in the body. This leads to increased blood pressure, heart rate, and breathing rate. These changes may cause problems for people with high blood pressure, weak hearts, or lung disease. High doses of nicotine in children and pets can lead to seizures and death. Tobacco contains a number of other unsafe chemicals. These chemicals are especially harmful when inhaled as smoke and can damage almost every organ in the body. Smokers live shorter lives than nonsmokers and are at risk of dying from a number of diseases and cancers. Tobacco smoke can also cause health problems for nonsmokers (due to inhaling secondhand smoke). Smoking is also a fire hazard. TUD usually starts in the late teenage years and is most common in young adults between the ages of 5518 and 25 years. People who start smoking earlier in life are more likely to continue smoking as adults. TUD is somewhat more common in men than women. People with TUD are at higher risk for using alcohol and other drugs of abuse. What increases the risk? Risk factors for TUD include:  Having family members with the disorder.  Being around people who use  tobacco.  Having an existing mental health issue such as schizophrenia, depression, bipolar disorder, ADHD, or posttraumatic stress disorder (PTSD). What are the signs or symptoms? People with tobacco use disorder have two or more of the following signs and symptoms within 12 months:  Use of more tobacco over a longer period than intended.  Not able to cut down or control tobacco use.  A lot of time spent obtaining or using tobacco.  Strong desire or urge to use tobacco (craving). Cravings may last for 6 months or longer after quitting.  Use of tobacco even when use leads to major problems at work, school, or home.  Use of tobacco even when use leads to relationship problems.  Giving up or cutting down on important life activities because of tobacco use.  Repeatedly using tobacco in situations where it puts you or others in physical danger, like smoking in bed.  Use of tobacco even when it is known that a physical or mental problem is likely related to tobacco use.  Physical problems are numerous and may include chronic bronchitis, emphysema, lung and other cancers, gum disease, high blood pressure, heart disease, and stroke.  Mental problems caused by tobacco may include difficulty sleeping and anxiety.  Need to use greater amounts of tobacco to get the same effect. This means you have developed a tolerance.  Withdrawal symptoms as a result of stopping or rapidly cutting back use. These symptoms may last a month or more after quitting and include the following:  Depressed, anxious, or irritable mood.  Difficulty concentrating.  Increased appetite.  Restlessness or trouble  sleeping.  Use of tobacco to avoid withdrawal symptoms. How is this diagnosed? Tobacco use disorder is diagnosed by your health care provider. A diagnosis may be made by:  Your health care provider asking questions about your tobacco use and any problems it may be causing.  A physical exam.  Lab  tests.  You may be referred to a mental health professional or addiction specialist. The severity of tobacco use disorder depends on the number of signs and symptoms you have:  Mild-Two or three symptoms.  Moderate-Four or five symptoms.  Severe-Six or more symptoms. How is this treated? Many people with tobacco use disorder are unable to quit on their own and need help. Treatment options include the following:  Nicotine replacement therapy (NRT). NRT provides nicotine without the other harmful chemicals in tobacco. NRT gradually lowers the dosage of nicotine in the body and reduces withdrawal symptoms. NRT is available in over-the-counter forms (gum, lozenges, and skin patches) as well as prescription forms (mouth inhaler and nasal spray).  Medicines.This may include:  Antidepressant medicine that may reduce nicotine cravings.  A medicine that acts on nicotine receptors in the brain to reduce cravings and withdrawal symptoms. It may also block the effects of tobacco in people with TUD who relapse.  Counseling or talk therapy. A form of talk therapy called behavioral therapy is commonly used to treat people with TUD. Behavioral therapy looks at triggers for tobacco use, how to avoid them, and how to cope with cravings. It is most effective in person or by phone but is also available in self-help forms (books and Internet websites).  Support groups. These provide emotional support, advice, and guidance for quitting tobacco. The most effective treatment for TUD is usually a combination of medicine, talk therapy, and support groups. Follow these instructions at home:  Keep all follow-up visits as directed by your health care provider. This is important.  Take medicines only as directed by your health care provider.  Check with your health care provider before starting new prescription or over-the-counter medicines. Contact a health care provider if:  You are not able to take your  medicines as prescribed.  Treatment is not helping your TUD and your symptoms get worse. Get help right away if:  You have serious thoughts about hurting yourself or others.  You have trouble breathing, chest pain, sudden weakness, or sudden numbness in part of your body. This information is not intended to replace advice given to you by your health care provider. Make sure you discuss any questions you have with your health care provider. Document Released: 08/09/2004 Document Revised: 08/06/2016 Document Reviewed: 01/30/2014 Elsevier Interactive Patient Education  2017 [employer]. [place].

## 2017-05-14 NOTE — Assessment & Plan Note (Signed)
minimize simple carbs. Increase exercise as tolerated.  

## 2017-05-14 NOTE — Progress Notes (Signed)
Subjective:    Patient ID: Monica Dillon, female    DOB: April 02, 1966, 51 y.o.   MRN: [id number]  Chief Complaint  Patient presents with  . Cough    HPI Patient is in today for evaluation of worsening respiratory symptoms and to discuss smoking cessation. She has been worsening over past week. Notes head and chest congestion productive of green phlegm. Noted CP and upper back pain with coughing. Coughing and wheezing keeping her up at night. Notes SOB with exertion. Denies palp/HA/fevers/GI or GU c/o. Taking meds as prescribed. She has cut down on her cigarette intake with her illness and is interested in trying to quit again.   PMH: see EMR   Past Surgical History:  Procedure Laterality Date  . TUBAL LIGATION  "about 20 yrs ago"    Family History  Problem Relation Age of Onset  . Diabetes Maternal Grandmother   . Drug abuse Father   . Cancer Father   . Cirrhosis Sister   . Alcohol abuse Sister   . Diverticulitis Mother   . Cancer Daughter        skin  . Aneurysm Maternal Aunt        brain    Social History   Social History  . Marital status: Divorced    Spouse name: N/A  . Number of children: N/A  . Years of education: 32   Occupational History  . [employer]    Social History Main Topics  . Smoking status: Current Every Day Smoker    Packs/day: 1.00    Years: 30.00    Types: Cigarettes  . Smokeless tobacco: Never Used  . Alcohol use 0.0 oz/week     Comment: occasional  . Drug use: No  . 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 Not on file   Social History Narrative  . No narrative on file    Outpatient Medications Prior to Visit  Medication Sig Dispense Refill  . acetaminophen (TYLENOL) 325 MG tablet Take 650 mg by mouth every 6 (six) hours as needed for headache.    Marland Kitchen azelastine (ASTELIN) 0.1 % nasal spray Place 2 sprays  into both nostrils 2 (two) times daily. Use in each nostril as directed 30 mL 3  . ranitidine (ZANTAC) 300 MG tablet Take 1 tablet (300 mg total) by mouth at bedtime. 90 tablet 1  . VENTOLIN HFA 108 (90 Base) MCG/ACT inhaler     . varenicline (CHANTIX STARTING MONTH PAK) 0.5 MG X 11 & 1 MG X 42 tablet Take one 0.5 mg tablet by mouth once daily for 3 days, then increase to one 0.5 mg tablet twice daily for 4 days, then increase to one 1 mg tablet twice daily. (Patient not taking: Reported on 05/10/2017) 53 tablet 0  . cyclobenzaprine (FLEXERIL) 10 MG tablet Take 1 tablet (10 mg total) by mouth 3 (three) times daily as needed for muscle spasms. 30 tablet 0  . HYDROcodone-homatropine (HYCODAN) 5-1.5 MG/5ML syrup Take 5 mLs by mouth 2 (two) times daily as needed for cough. 120 mL 0  . varenicline (CHANTIX CONTINUING MONTH PAK) 1 MG tablet Take 1 tablet (1 mg total) by mouth 2 (two) times daily. 30 tablet 4   No facility-administered medications prior to visit.     No Known Allergies  Review of Systems  Constitutional: Negative for fever and malaise/fatigue.  HENT: Positive for congestion.   Eyes: Negative for blurred vision.  Respiratory: Positive for cough, sputum production, shortness of breath and wheezing.   Cardiovascular: Positive for chest pain. Negative for palpitations and leg swelling.  Gastrointestinal: Negative for abdominal pain, blood in stool and nausea.  Genitourinary: Negative for dysuria and frequency.  Musculoskeletal: Positive for back pain. Negative for falls.  Skin: Negative for rash.  Neurological: Negative for dizziness, loss of consciousness and headaches.  Endo/Heme/Allergies: Negative for environmental allergies.  Psychiatric/Behavioral: Negative for depression. The patient is not nervous/anxious.        Objective:    Physical Exam  Constitutional: She is oriented to person, place, and time. She appears well-developed and well-nourished. No distress.  HENT:    Head: Normocephalic and atraumatic.  Nose: Nose normal.  Nasal mucosa bogy and erythematous.   Eyes: Right eye exhibits no discharge. Left eye exhibits no discharge.  Neck: Normal range of motion. Neck supple.  Cardiovascular: Normal rate and regular rhythm.   No murmur heard. Pulmonary/Chest: Effort normal and breath sounds normal.  Abdominal: Soft. Bowel sounds are normal. There is no tenderness.  Musculoskeletal: She exhibits no edema.  Neurological: She is alert and oriented to person, place, and time.  Skin: Skin is warm and dry.  Psychiatric: She has a normal mood and affect.  Nursing note and vitals reviewed.   There were no vitals taken for this visit. Wt Readings from Last 3 Encounters:  01/15/17 133 lb 6.4 oz (60.5 kg)  01/12/17 134 lb 6.4 oz (61 kg)  01/08/17 133 lb 3.2 oz (60.4 kg)     Lab Results  Component Value Date   WBC 10.6 (H) 08/07/2016   HGB 11.8 (L) 08/07/2016   HCT 35.6 (L) 08/07/2016   PLT 338.0 08/07/2016   GLUCOSE 111 (H) 05/11/2016   ALT 16 05/11/2016   AST 14 05/11/2016   NA 137 05/11/2016   K 3.7 05/11/2016   CL 103 05/11/2016   CREATININE 0.59 05/11/2016   BUN 8 05/11/2016   CO2 27 05/11/2016   TSH 1.21 05/11/2016    Lab Results  Component Value Date   TSH 1.21 05/11/2016   Lab Results  Component Value Date   WBC 10.6 (H) 08/07/2016   HGB 11.8 (L) 08/07/2016   HCT 35.6 (L) 08/07/2016   MCV 81.9 08/07/2016   PLT 338.0 08/07/2016   Lab Results  Component Value Date   NA 137 05/11/2016   K 3.7 05/11/2016   CO2 27 05/11/2016   GLUCOSE 111 (H) 05/11/2016   BUN 8 05/11/2016   CREATININE 0.59 05/11/2016   BILITOT 0.3 05/11/2016   ALKPHOS 58 05/11/2016   AST 14 05/11/2016   ALT 16 05/11/2016   PROT 7.1 05/11/2016   ALBUMIN 4.1 05/11/2016   CALCIUM 9.3 05/11/2016   GFR 114.81 05/11/2016   No results found for: CHOL No results found for: HDL No results found for: LDLCALC No results found for: TRIG No results found for:  CHOLHDL No results found for: HYQM5H     Assessment & Plan:   Problem List Items Addressed This Visit    GERD (gastroesophageal reflux disease)   Cough    Bronchitis vs pneumonia in a smoker, symptoms worsening over past week. Started on Hycodan prn qhs, Medrol dose pack and Amoxicillin is no improvement. Encouraged increased rest and hydration, add probiotics, zinc such as Coldeze or Xicam. Treat fevers as needed      Tobacco abuse disorder  Encouraged complete cessation. Discussed need to quit as relates to risk of numerous cancers, cardiac and pulmonary disease as well as neurologic complications. Counseled for greater than 3 minutes. Given rx for Chantix which has helped her temporarily in the past.       Hyperglycemia    minimize simple carbs. Increase exercise as tolerated.          I have discontinued Ms. Bass's cyclobenzaprine and HYDROcodone-homatropine. I have also changed her VENTOLIN HFA to albuterol. Additionally, I am having her start on HYDROcodone-homatropine, amoxicillin, and methylPREDNISolone. Lastly, I am having her maintain her acetaminophen, azelastine, varenicline, and ranitidine.  Meds ordered this encounter  Medications  . albuterol (VENTOLIN HFA) 108 (90 Base) MCG/ACT inhaler    Sig: Inhale 1-2 puffs into the lungs every 4 (four) hours as needed for wheezing or shortness of breath.    Dispense:  18 g    Refill:  1  . HYDROcodone-homatropine (HYCODAN) 5-1.5 MG/5ML syrup    Sig: Take 5 mLs by mouth at bedtime as needed for cough.    Dispense:  120 mL    Refill:  0  . amoxicillin (AMOXIL) 500 MG capsule    Sig: Take 1 capsule (500 mg total) by mouth 3 (three) times daily.    Dispense:  30 capsule    Refill:  0  . methylPREDNISolone (MEDROL) 4 MG tablet    Sig: 5 tab po qd X 1d then 4 tab po qd X 1d then 3 tab po qd X 1d then 2 tab po qd then 1 tab po qd    Dispense:  15 tablet    Refill:  0      Danise EdgeStacey Tonatiuh Mallon, MD

## 2017-05-14 NOTE — Assessment & Plan Note (Signed)
Bronchitis vs pneumonia in a smoker, symptoms worsening over past week. Started on Hycodan prn qhs, Medrol dose pack and Amoxicillin is no improvement. Encouraged increased rest and hydration, add probiotics, zinc such as Coldeze or Xicam. Treat fevers as needed

## 2017-05-14 NOTE — Assessment & Plan Note (Addendum)
Encouraged complete cessation. Discussed need to quit as relates to risk of numerous cancers, cardiac and pulmonary disease as well as neurologic complications. Counseled for greater than 3 minutes. Given rx for Chantix which has helped her temporarily in the past.

## 2017-08-18 ENCOUNTER — Encounter (HOSPITAL_BASED_OUTPATIENT_CLINIC_OR_DEPARTMENT_OTHER): Payer: Self-pay | Admitting: Emergency Medicine

## 2017-08-18 ENCOUNTER — Emergency Department (HOSPITAL_BASED_OUTPATIENT_CLINIC_OR_DEPARTMENT_OTHER)
Admission: EM | Admit: 2017-08-18 | Discharge: 2017-08-19 | Disposition: A | Discharge status: Home / Self Care | Payer: BLUE CROSS/BLUE SHIELD | Attending: Emergency Medicine | Admitting: Emergency Medicine

## 2017-08-18 DIAGNOSIS — W57XXXA Bitten or stung by nonvenomous insect and other nonvenomous arthropods, initial encounter: Secondary | ICD-10-CM | POA: Diagnosis not present

## 2017-08-18 DIAGNOSIS — S90861A Insect bite (nonvenomous), right foot, initial encounter: Secondary | ICD-10-CM | POA: Diagnosis not present

## 2017-08-18 DIAGNOSIS — Y929 Unspecified place or not applicable: Secondary | ICD-10-CM | POA: Diagnosis not present

## 2017-08-18 DIAGNOSIS — Y939 Activity, unspecified: Secondary | ICD-10-CM | POA: Diagnosis not present

## 2017-08-18 DIAGNOSIS — Y999 Unspecified external cause status: Secondary | ICD-10-CM | POA: Insufficient documentation

## 2017-08-18 DIAGNOSIS — F17210 Nicotine dependence, cigarettes, uncomplicated: Secondary | ICD-10-CM | POA: Insufficient documentation

## 2017-08-18 DIAGNOSIS — Z79899 Other long term (current) drug therapy: Secondary | ICD-10-CM | POA: Insufficient documentation

## 2017-08-18 NOTE — ED Triage Notes (Signed)
Patient states she was bit by a fly on her right foot today around 6pm. Applied alcohol, hydrocortisone cream, and cold compress. States it hurts to walk. Redness and swelling to right foot.

## 2017-08-19 MED ORDER — BACITRACIN ZINC 500 UNIT/GM EX OINT
TOPICAL_OINTMENT | Freq: Once | CUTANEOUS | Status: AC
  Administered 2017-08-19: 1 via TOPICAL
  Filled 2017-08-19: qty 28.35

## 2017-08-19 MED ORDER — DIPHENHYDRAMINE HCL 25 MG PO CAPS
25.0000 mg | ORAL_CAPSULE | Freq: Once | ORAL | Status: AC
  Administered 2017-08-19: 25 mg via ORAL
  Filled 2017-08-19: qty 1

## 2017-08-19 NOTE — ED Provider Notes (Signed)
[facility]   CSN: [id number] Arrival date & time: 08/18/17  2343     History   Chief Complaint Chief Complaint  Patient presents with  . Insect Bite    right foot    HPI Monica Dillon is a 51 y.o. female who presents with right foot pain and redness that began approximately 6 PM after an insect bite. Patient states that since then pain and redness has progressively worsened, prompting ED visit. Patient also reports associated itching to the dorsal aspect of the foot. Patient attempted to apply alcohol, hydrocortisone cream and cold compresses with minimal relief. Patient states that she is still able to ambulate without difficulty. Patient denies any known allergies to insects. Patient denies any fever, lip or tongue swelling, chest pain, difficulty breathing, calf pain, no numbness/weakness.  The history is provided by the patient.    Past Medical History:  Diagnosis Date  . Allergy   . Anxiety and depression 02/06/2016  . Cervical cancer screening 10/12/2016   Menarche at 12 Regular and moderate flow one history of abnormal pap in past at age 1, ASGUS, frozen then testing normal G3P, s/p 3 SVA No history of abnormal MGM Noconcerns today Cryotherapy and tubal: gyn surgeries  . GERD (gastroesophageal reflux disease)   . History of chicken pox   . Hyperlipidemia 01/08/2017  . Tobacco abuse disorder 10/12/2016  . Viral respiratory illness 01/14/2017    Patient Active Problem List   Diagnosis Date Noted  . Viral respiratory illness 01/14/2017  . TOS (thoracic outlet syndrome) 01/08/2017  . Hyperlipidemia 01/08/2017  . Hyperglycemia 10/15/2016  . Leukocytosis 10/15/2016  . Cervical cancer screening 10/12/2016  . Tobacco abuse disorder 10/12/2016  . Cough 05/07/2016  . Insomnia 02/06/2016  . Preventative health care 02/06/2016  . Anxiety and depression 02/06/2016  . Allergy   . GERD (gastroesophageal reflux disease)   . History of chicken pox      Past Surgical History:  Procedure Laterality Date  . TUBAL LIGATION  "about 20 yrs ago"    OB History    No data available       Home Medications    Prior to Admission medications   Medication Sig Start Date End Date Taking? Authorizing Provider  acetaminophen (TYLENOL) 325 MG tablet Take 650 mg by mouth every 6 (six) hours as needed for headache.    [provider]  albuterol (VENTOLIN HFA) 108 (90 Base) MCG/ACT inhaler Inhale 1-2 puffs into the lungs every 4 (four) hours as needed for wheezing or shortness of breath. 05/10/17   Bradd Canary, MD  amoxicillin (AMOXIL) 500 MG capsule Take 1 capsule (500 mg total) by mouth 3 (three) times daily. 05/10/17   Bradd Canary, MD  azelastine (ASTELIN) 0.1 % nasal spray Place 2 sprays into both nostrils 2 (two) times daily. Use in each nostril as directed 10/12/16   Bradd Canary, MD  HYDROcodone-homatropine [facility]) 5-1.5 MG/5ML syrup Take 5 mLs by mouth at bedtime as needed for cough. 05/10/17   Bradd Canary, MD  methylPREDNISolone (MEDROL) 4 MG tablet 5 tab po qd X 1d then 4 tab po qd X 1d then 3 tab po qd X 1d then 2 tab po qd then 1 tab po qd 05/10/17   Bradd Canary, MD  ranitidine (ZANTAC) 300 MG tablet Take 1 tablet (300 mg total) by mouth at bedtime. 03/12/17   Bradd Canary, MD  varenicline (CHANTIX STARTING MONTH PAK) 0.5 MG  X 11 & 1 MG X 42 tablet Take one 0.5 mg tablet by mouth once daily for 3 days, then increase to one 0.5 mg tablet twice daily for 4 days, then increase to one 1 mg tablet twice daily. Patient not taking: Reported on 05/10/2017 01/08/17   Bradd Canary, MD    Family History Family History  Problem Relation Age of Onset  . Diabetes Maternal Grandmother   . Drug abuse Father   . Cancer Father   . Cirrhosis Sister   . Alcohol abuse Sister   . Diverticulitis Mother   . Cancer Daughter        skin  . Aneurysm Maternal Aunt        brain    Social History Social History  Substance Use  Topics  . Smoking status: Current Every Day Smoker    Packs/day: 1.00    Years: 30.00    Types: Cigarettes  . Smokeless tobacco: Never Used  . Alcohol use 0.0 oz/week     Comment: occasional     Allergies   Patient has no known allergies.   Review of Systems Review of Systems  Constitutional: Negative for fever.  Respiratory: Negative for shortness of breath.   Cardiovascular: Negative for chest pain and leg swelling.  Musculoskeletal:       Right foot pain  Skin: Positive for color change.  Neurological: Negative for weakness and numbness.     Physical Exam Updated Vital Signs BP 133/79 (BP Location: Left Arm)   Pulse 98   Temp 97.8 F (36.6 C) (Oral)   Resp 18   Ht 5' (1.524 m)   Wt 59 kg (130 lb)   SpO2 96%   BMI 25.39 kg/m   Physical Exam  Constitutional: She appears well-developed and well-nourished.  Sitting comfortably on examination table  HENT:  Head: Normocephalic and atraumatic.  No oral angioedema  Eyes: Conjunctivae and EOM are normal. Right eye exhibits no discharge. Left eye exhibits no discharge. No scleral icterus.  Cardiovascular:  Pulses:      Dorsalis pedis pulses are 2+ on the right side, and 2+ on the left side.  Pulmonary/Chest: Effort normal.  No evidence of respiratory distress. Able to speak in full sentences without difficulty.  Musculoskeletal:  Full range of motion of right ankle without difficulty. Full range of motion of right toes without difficulty. No calf tenderness palpation. No edema of the right lower extremity.  Neurological: She is alert.  Skin: Skin is warm and dry. Capillary refill takes less than 2 seconds. Abrasion noted. There is erythema.  3 x 2 cm area of erythema with minimal soft tissue swelling overlying the dorsal aspect of the foot. No swelling overlying the ankle joint. Small superficial abrasion noted to the medial aspect of the foot.  Psychiatric: She has a normal mood and affect. Her speech is normal and  behavior is normal.  Nursing note and vitals reviewed.    ED Treatments / Results  Labs (all labs ordered are listed, but only abnormal results are displayed) Labs Reviewed - No data to display  EKG  EKG Interpretation None       Radiology No results found.  Procedures Procedures (including critical care time)  Medications Ordered in ED Medications  bacitracin ointment (1 application Topical Given 08/19/17 0025)  diphenhydrAMINE (BENADRYL) capsule 25 mg (25 mg Oral Given 08/19/17 0025)     Initial Impression / Assessment and Plan / ED Course  I have reviewed the triage  vital signs and the nursing notes.  Pertinent labs & imaging results that were available during my care of the patient were reviewed by me and considered in my medical decision making (see chart for details).     51 year old female who presents with right foot pain and redness that began at 6:30 PM this afternoon after an insect bite. No fevers, oral angioedema, difficulty breathing, chest pain. Patient is afebrile, non-toxic appearing, sitting comfortably on examination table. Vital signs reviewed and stable. Physical exam is consistent with insect bite and localized to the dorsal aspect of the foot. History/physical exam are not concerning for gout, septic arthritis or DVT. Plan to treat symptomatically. Patient instructed to apply ice to the affected area. Benadryl given the department for symptomatic relief. Postop shoe provided for support and comfort. Conservative therapies discussed with patient. Patient instructed follow-up with her primary care doctor next 24-48 hours for further evaluation. Strict return precautions discussed. Patient expresses understanding and agreement to plan.     Final Clinical Impressions(s) / ED Diagnoses   Final diagnoses:  Insect bite, initial encounter    New Prescriptions Discharge Medication List as of 08/19/2017 12:20 AM       Maxwell CaulLayden, Lindsey A, PA-C 08/19/17  0043    Molpus, Jonny RuizJohn, MD 08/19/17 0127

## 2017-08-19 NOTE — Discharge Instructions (Signed)
Take Benadryl as directed for the next 2-3 days to help with symptoms.  Apply ice for the next 48 hours.  He can with a postop shoe for support and stabilization.  Follow-up with her primary care doctor next 24-48 hours for further evaluation.  Return emergency Department for any worsening pain, fever, increased redness/swelling, numbness of the foot, difficulty walking, drainage or any other worsening or concerning symptoms.

## 2017-09-03 ENCOUNTER — Encounter: Payer: BLUE CROSS/BLUE SHIELD | Admitting: Family Medicine

## 2017-09-04 ENCOUNTER — Other Ambulatory Visit: Payer: Self-pay | Admitting: Family Medicine

## 2017-09-04 DIAGNOSIS — Z0000 Encounter for general adult medical examination without abnormal findings: Secondary | ICD-10-CM

## 2017-09-20 ENCOUNTER — Encounter: Payer: Self-pay | Admitting: Family Medicine

## 2017-09-21 ENCOUNTER — Other Ambulatory Visit: Payer: Self-pay | Admitting: Family Medicine

## 2017-09-21 MED ORDER — VARENICLINE TARTRATE 1 MG PO TABS: 1.0000 mg | ORAL_TABLET | Freq: Two times a day (BID) | ORAL | 4 refills | Status: DC

## 2017-09-21 MED ORDER — VARENICLINE TARTRATE 0.5 MG X 11 & 1 MG X 42 PO MISC: ORAL | 0 refills | Status: DC

## 2017-09-24 ENCOUNTER — Encounter: Payer: Self-pay | Admitting: Family Medicine

## 2017-09-25 ENCOUNTER — Other Ambulatory Visit: Payer: Self-pay

## 2017-09-25 MED ORDER — VARENICLINE TARTRATE 1 MG PO TABS: 1.0000 mg | ORAL_TABLET | Freq: Two times a day (BID) | ORAL | 4 refills | Status: DC

## 2017-10-01 ENCOUNTER — Encounter: Payer: Self-pay | Admitting: Family Medicine

## 2017-10-02 ENCOUNTER — Encounter: Payer: Self-pay | Admitting: Family Medicine

## 2017-10-04 ENCOUNTER — Other Ambulatory Visit: Payer: Self-pay

## 2017-10-04 MED ORDER — BUPROPION HCL ER (XL) 150 MG PO TB24: ORAL_TABLET | ORAL | 2 refills | Status: DC

## 2017-11-15 ENCOUNTER — Encounter: Payer: Self-pay | Admitting: Family Medicine

## 2017-11-15 ENCOUNTER — Ambulatory Visit: Payer: BLUE CROSS/BLUE SHIELD | Admitting: Family Medicine

## 2017-11-15 VITALS — BP 119/66 | HR 75 | Temp 97.9°F | Resp 16 | Ht 59.84 in | Wt 132.6 lb

## 2017-11-15 DIAGNOSIS — Z8619 Personal history of other infectious and parasitic diseases: Secondary | ICD-10-CM | POA: Diagnosis not present

## 2017-11-15 DIAGNOSIS — R739 Hyperglycemia, unspecified: Secondary | ICD-10-CM | POA: Diagnosis not present

## 2017-11-15 DIAGNOSIS — E782 Mixed hyperlipidemia: Secondary | ICD-10-CM

## 2017-11-15 DIAGNOSIS — Z720 Tobacco use: Secondary | ICD-10-CM

## 2017-11-15 DIAGNOSIS — Z0000 Encounter for general adult medical examination without abnormal findings: Secondary | ICD-10-CM

## 2017-11-15 DIAGNOSIS — D72829 Elevated white blood cell count, unspecified: Secondary | ICD-10-CM

## 2017-11-15 NOTE — Progress Notes (Signed)
Subjective:  I acted as a Neurosurgeonscribe for [employer] IncDr.Olivya Sobol. Monica Dillon, RMA   Patient ID: Monica Dillon, female    DOB: [birth year], 51 y.o.   MRN: [id number]  Chief Complaint  Patient presents with  . Annual Exam    HPI  Patient is in today for annual exam and follow up on chronic medical concerns including hyperglycemia, hyperlipidemia, tobacco use and more. sher respiratory symptoms have improved with treatment. Denies CP/palp/SOB/HA/congestion/fevers/GI or GU c/o. Taking meds as prescribed. Doing well with ADLs at home. No polyuria or polydipsia. Trying to maintain a heart healthy diet.  Patient Care Team: Bradd CanaryBlyth, Anacaren Kohan A, MD as PCP - General (Family Medicine)   PMH: see EMR  Past Surgical History:  Procedure Laterality Date  . TUBAL LIGATION  "about 20 yrs ago"    Family History  Problem Relation Age of Onset  . Diabetes Maternal Grandmother   . Drug abuse Father   . Cancer Father   . Cirrhosis Sister   . Alcohol abuse Sister   . Diverticulitis Mother   . Cancer Daughter        skin  . Aneurysm Maternal Aunt        brain    Social History   Socioeconomic History  . Marital status: Divorced    Spouse name: Not on file  . Number of children: Not on file  . Years of education: 6112  . Highest education level: Not on file  Social Needs  . Financial resource strain: Not on file  . Food insecurity - worry: Not on file  . Food insecurity - inability: Not on file  . Transportation needs - medical: Not on file  . Transportation needs - non-medical: Not on file  Occupational History  . Occupation: [employer]  Tobacco Use  . Smoking status: Current Every Day Smoker    Packs/day: 1.00    Years: 30.00    Pack years: 30.00    Types: Cigarettes  . Smokeless tobacco: Never Used  Substance and Sexual Activity  . Alcohol use: Yes    Alcohol/week: 0.0 oz    Comment: occasional  . Drug use: No  . Sexual activity: Not on file    Comment: Lives with mother, boyfriend, adopted  grandson, works at call center, no dietary restrictions  Other Topics Concern  . Not on file  Social History Narrative  . Not on file    Outpatient Medications Prior to Visit  Medication Sig Dispense Refill  . acetaminophen (TYLENOL) 325 MG tablet Take 650 mg by mouth every 6 (six) hours as needed for headache.    . albuterol (VENTOLIN HFA) 108 (90 Base) MCG/ACT inhaler Inhale 1-2 puffs into the lungs every 4 (four) hours as needed for wheezing or shortness of breath. 18 g 1  . azelastine (ASTELIN) 0.1 % nasal spray Place 2 sprays into both nostrils 2 (two) times daily. Use in each nostril as directed 30 mL 3  . buPROPion (WELLBUTRIN XL) 150 MG 24 hr tablet Take 1 tablet by mouth daily for 7 days then take 1 tablet by mouth two times daily 60 tablet 2  . ranitidine (ZANTAC) 300 MG tablet TAKE 1 TABLET (300 MG TOTAL) BY MOUTH AT BEDTIME. 90 tablet 0  . amoxicillin (AMOXIL) 500 MG capsule Take 1 capsule (500 mg total) by mouth 3 (three) times daily. 30 capsule 0  . HYDROcodone-homatropine (HYCODAN) 5-1.5 MG/5ML syrup Take 5 mLs by mouth at bedtime as needed for cough. 120 mL 0  .  methylPREDNISolone (MEDROL) 4 MG tablet 5 tab po qd X 1d then 4 tab po qd X 1d then 3 tab po qd X 1d then 2 tab po qd then 1 tab po qd 15 tablet 0   No facility-administered medications prior to visit.     No Known Allergies  Review of Systems  Constitutional: Negative for fever and malaise/fatigue.  HENT: Negative for congestion.   Eyes: Negative for blurred vision.  Respiratory: Negative for shortness of breath.   Cardiovascular: Negative for chest pain, palpitations and leg swelling.  Gastrointestinal: Negative for abdominal pain, blood in stool and nausea.  Genitourinary: Negative for dysuria and frequency.  Musculoskeletal: Negative for falls.  Skin: Negative for rash.  Neurological: Negative for dizziness, loss of consciousness and headaches.  Endo/Heme/Allergies: Negative for environmental allergies.    Psychiatric/Behavioral: Negative for depression. The patient is not nervous/anxious.        Objective:    Physical Exam  Constitutional: She is oriented to person, place, and time. She appears well-developed and well-nourished. No distress.  HENT:  Head: Normocephalic and atraumatic.  Eyes: Conjunctivae are normal.  Neck: Neck supple. No thyromegaly present.  Cardiovascular: Normal rate, regular rhythm and normal heart sounds.  No murmur heard. Pulmonary/Chest: Effort normal and breath sounds normal. No respiratory distress.  Abdominal: Soft. Bowel sounds are normal. She exhibits no distension and no mass. There is no tenderness.  Musculoskeletal: She exhibits no edema.  Lymphadenopathy:    She has no cervical adenopathy.  Neurological: She is alert and oriented to person, place, and time.  Skin: Skin is warm and dry.  Psychiatric: She has a normal mood and affect. Her behavior is normal.    BP 119/66 (BP Location: Left Arm, Patient Position: Sitting, Cuff Size: Normal)   Pulse 75   Temp 97.9 F (36.6 C) (Oral)   Resp 16   Ht 4' 11.84" (1.52 m)   Wt 132 lb 9.6 oz (60.1 kg)   SpO2 99%   BMI 26.03 kg/m  Wt Readings from Last 3 Encounters:  11/15/17 132 lb 9.6 oz (60.1 kg)  08/18/17 130 lb (59 kg)  01/15/17 133 lb 6.4 oz (60.5 kg)   BP Readings from Last 3 Encounters:  11/15/17 119/66  08/18/17 133/79  01/15/17 110/82     Immunization History  Administered Date(s) Administered  . Influenza-Unspecified 10/19/2015, 09/23/2016  . Zoster Recombinat (Shingrix) 11/15/2017    Health Maintenance  Topic Date Due  . HIV Screening  08/28/1981  . TETANUS/TDAP  08/28/1985  . COLONOSCOPY  08/28/2016  . MAMMOGRAM  02/07/2018  . PAP SMEAR  10/13/2019  . INFLUENZA VACCINE  Completed    Lab Results  Component Value Date   WBC 8.6 11/15/2017   HGB 13.3 11/15/2017   HCT 40.3 11/15/2017   PLT 382.0 11/15/2017   GLUCOSE 77 11/15/2017   CHOL 227 (H) 11/15/2017   TRIG 197.0  (H) 11/15/2017   HDL 30.90 (L) 11/15/2017   LDLCALC 156 (H) 11/15/2017   ALT 17 11/15/2017   AST 17 11/15/2017   NA 139 11/15/2017   K 4.2 11/15/2017   CL 103 11/15/2017   CREATININE 0.72 11/15/2017   BUN 10 11/15/2017   CO2 31 11/15/2017   TSH 1.15 11/15/2017   HGBA1C 5.7 11/15/2017    Lab Results  Component Value Date   TSH 1.15 11/15/2017   Lab Results  Component Value Date   WBC 8.6 11/15/2017   HGB 13.3 11/15/2017   HCT 40.3 11/15/2017  MCV 90.7 11/15/2017   PLT 382.0 11/15/2017   Lab Results  Component Value Date   NA 139 11/15/2017   K 4.2 11/15/2017   CO2 31 11/15/2017   GLUCOSE 77 11/15/2017   BUN 10 11/15/2017   CREATININE 0.72 11/15/2017   BILITOT 0.3 11/15/2017   ALKPHOS 52 11/15/2017   AST 17 11/15/2017   ALT 17 11/15/2017   PROT 7.2 11/15/2017   ALBUMIN 4.1 11/15/2017   CALCIUM 9.4 11/15/2017   GFR 90.69 11/15/2017   Lab Results  Component Value Date   CHOL 227 (H) 11/15/2017   Lab Results  Component Value Date   HDL 30.90 (L) 11/15/2017   Lab Results  Component Value Date   LDLCALC 156 (H) 11/15/2017   Lab Results  Component Value Date   TRIG 197.0 (H) 11/15/2017   Lab Results  Component Value Date   CHOLHDL 7 11/15/2017   Lab Results  Component Value Date   HGBA1C 5.7 11/15/2017         Assessment & Plan:   Problem List Items Addressed This Visit    History of chicken pox    Given Shingrix # 1 today      Relevant Orders   Varicella-zoster vaccine IM (Shingrix) (Completed)   Preventative health care    Patient encouraged to maintain heart healthy diet, regular exercise, adequate sleep. Consider daily probiotics. Take medications as prescribed      Relevant Orders   TSH (Completed)   Tobacco abuse disorder    September 24, 2017 was last cigarette. Is using Wellbutrin to cut cravings. It has caused some shaking but she feels it is worth it, initially she had to use nicorette up to 8 x a day now down to 2 a week.        Hyperglycemia    hgba1c acceptable, minimize simple carbs. Increase exercise as tolerated.       Relevant Orders   Hemoglobin A1c (Completed)   Comprehensive metabolic panel (Completed)   TSH (Completed)   Leukocytosis    Check cbc      Relevant Orders   CBC (Completed)   Hyperlipidemia    Encouraged heart healthy diet, increase exercise, avoid trans fats, consider a krill oil cap daily      Relevant Orders   Lipid panel (Completed)   TSH (Completed)      I have discontinued Elania Kazlauskas's HYDROcodone-homatropine, amoxicillin, and methylPREDNISolone. I am also having her maintain her acetaminophen, azelastine, albuterol, ranitidine, and buPROPion.  No orders of the defined types were placed in this encounter.   CMA served as Neurosurgeon during this visit. History, Physical and Plan performed by medical provider. Documentation and orders reviewed and attested to.  Danise Edge, MD

## 2017-11-15 NOTE — Patient Instructions (Addendum)
Preventive Care 40-64 Years, Female Preventive care refers to lifestyle choices and visits with your health care provider that can promote health and wellness. What does preventive care include?  A yearly physical exam. This is also called an annual well check.  Dental exams once or twice a year.  Routine eye exams. Ask your health care provider how often you should have your eyes checked.  Personal lifestyle choices, including: ? Daily care of your teeth and gums. ? Regular physical activity. ? Eating a healthy diet. ? Avoiding tobacco and drug use. ? Limiting alcohol use. ? Practicing safe sex. ? Taking low-dose aspirin daily starting at age 50. ? Taking vitamin and mineral supplements as recommended by your health care provider. What happens during an annual well check? The services and screenings done by your health care provider during your annual well check will depend on your age, overall health, lifestyle risk factors, and family history of disease. Counseling Your health care provider may ask you questions about your:  Alcohol use.  Tobacco use.  Drug use.  Emotional well-being.  Home and relationship well-being.  Sexual activity.  Eating habits.  Work and work environment.  Method of birth control.  Menstrual cycle.  Pregnancy history.  Screening You may have the following tests or measurements:  Height, weight, and BMI.  Blood pressure.  Lipid and cholesterol levels. These may be checked every 5 years, or more frequently if you are over 50 years old.  Skin check.  Lung cancer screening. You may have this screening every year starting at age 55 if you have a 30-pack-year history of smoking and currently smoke or have quit within the past 15 years.  Fecal occult blood test (FOBT) of the stool. You may have this test every year starting at age 50.  Flexible sigmoidoscopy or colonoscopy. You may have a sigmoidoscopy every 5 years or a colonoscopy  every 10 years starting at age 50.  Hepatitis C blood test.  Hepatitis B blood test.  Sexually transmitted disease (STD) testing.  Diabetes screening. This is done by checking your blood sugar (glucose) after you have not eaten for a while (fasting). You may have this done every 1-3 years.  Mammogram. This may be done every 1-2 years. Talk to your health care provider about when you should start having regular mammograms. This may depend on whether you have a family history of breast cancer.  BRCA-related cancer screening. This may be done if you have a family history of breast, ovarian, tubal, or peritoneal cancers.  Pelvic exam and Pap test. This may be done every 3 years starting at age 21. Starting at age 30, this may be done every 5 years if you have a Pap test in combination with an HPV test.  Bone density scan. This is done to screen for osteoporosis. You may have this scan if you are at high risk for osteoporosis.  Discuss your test results, treatment options, and if necessary, the need for more tests with your health care provider. Vaccines Your health care provider may recommend certain vaccines, such as:  Influenza vaccine. This is recommended every year.  Tetanus, diphtheria, and acellular pertussis (Tdap, Td) vaccine. You may need a Td booster every 10 years.  Varicella vaccine. You may need this if you have not been vaccinated.  Zoster vaccine. You may need this after age 60.  Measles, mumps, and rubella (MMR) vaccine. You may need at least one dose of MMR if you were born in   1957 or later. You may also need a second dose.  Pneumococcal 13-valent conjugate (PCV13) vaccine. You may need this if you have certain conditions and were not previously vaccinated.  Pneumococcal polysaccharide (PPSV23) vaccine. You may need one or two doses if you smoke cigarettes or if you have certain conditions.  Meningococcal vaccine. You may need this if you have certain  conditions.  Hepatitis A vaccine. You may need this if you have certain conditions or if you travel or work in places where you may be exposed to hepatitis A.  Hepatitis B vaccine. You may need this if you have certain conditions or if you travel or work in places where you may be exposed to hepatitis B.  Haemophilus influenzae type b (Hib) vaccine. You may need this if you have certain conditions.  Talk to your health care provider about which screenings and vaccines you need and how often you need them. This information is not intended to replace advice given to you by your health care provider. Make sure you discuss any questions you have with your health care provider. Document Released: 12/31/2015 Document Revised: 08/23/2016 Document Reviewed: 10/05/2015 Elsevier Interactive Patient Education  2017 [employer].

## 2017-11-15 NOTE — Assessment & Plan Note (Signed)
Patient encouraged to maintain heart healthy diet, regular exercise, adequate sleep. Consider daily probiotics. Take medications as prescribed 

## 2017-11-15 NOTE — Assessment & Plan Note (Signed)
Encouraged heart healthy diet, increase exercise, avoid trans fats, consider a krill oil cap daily 

## 2017-11-15 NOTE — Assessment & Plan Note (Signed)
September 24, 2017 was last cigarette. Is using Wellbutrin to cut cravings. It has caused some shaking but she feels it is worth it, initially she had to use nicorette up to 8 x a day now down to 2 a week.

## 2017-11-15 NOTE — Assessment & Plan Note (Signed)
Check cbc 

## 2017-11-15 NOTE — Assessment & Plan Note (Signed)
hgba1c acceptable, minimize simple carbs. Increase exercise as tolerated.  

## 2017-11-15 NOTE — Assessment & Plan Note (Signed)
Given Shingrix # 1 today

## 2017-11-16 LAB — CBC
HCT: 40.3 % (ref 36.0–46.0)
HEMOGLOBIN: 13.3 g/dL (ref 12.0–15.0)
MCHC: 32.9 g/dL (ref 30.0–36.0)
MCV: 90.7 fl (ref 78.0–100.0)
PLATELETS: 382 10*3/uL (ref 150.0–400.0)
RBC: 4.44 Mil/uL (ref 3.87–5.11)
RDW: 14.4 % (ref 11.5–15.5)
WBC: 8.6 10*3/uL (ref 4.0–10.5)

## 2017-11-16 LAB — COMPREHENSIVE METABOLIC PANEL
ALBUMIN: 4.1 g/dL (ref 3.5–5.2)
ALT: 17 U/L (ref 0–35)
AST: 17 U/L (ref 0–37)
Alkaline Phosphatase: 52 U/L (ref 39–117)
BUN: 10 mg/dL (ref 6–23)
CALCIUM: 9.4 mg/dL (ref 8.4–10.5)
CHLORIDE: 103 meq/L (ref 96–112)
CO2: 31 mEq/L (ref 19–32)
CREATININE: 0.72 mg/dL (ref 0.40–1.20)
GFR: 90.69 mL/min (ref >60.00)
Glucose, Bld: 77 mg/dL (ref 70–99)
Potassium: 4.2 mEq/L (ref 3.5–5.1)
Sodium: 139 mEq/L (ref 135–145)
Total Bilirubin: 0.3 mg/dL (ref 0.2–1.2)
Total Protein: 7.2 g/dL (ref 6.0–8.3)

## 2017-11-16 LAB — LIPID PANEL
CHOLESTEROL: 227 mg/dL — AB (ref 0–200)
HDL: 30.9 mg/dL — AB (ref >39.00)
LDL CALC: 156 mg/dL — AB (ref 0–99)
NonHDL: 195.8
TRIGLYCERIDES: 197 mg/dL — AB (ref 0.0–149.0)
Total CHOL/HDL Ratio: 7
VLDL: 39.4 mg/dL (ref 0.0–40.0)

## 2017-11-16 LAB — TSH: TSH: 1.15 u[IU]/mL (ref 0.35–4.50)

## 2017-11-16 LAB — HEMOGLOBIN A1C: Hgb A1c MFr Bld: 5.7 % (ref 4.6–6.5)

## 2017-12-03 ENCOUNTER — Other Ambulatory Visit: Payer: Self-pay | Admitting: Family Medicine

## 2017-12-03 DIAGNOSIS — Z0000 Encounter for general adult medical examination without abnormal findings: Secondary | ICD-10-CM

## 2018-01-22 IMAGING — MG MM DIGITAL SCREENING BILAT W/ TOMO W/ CAD
8 series · 9 of 24 positions shown · non-contrast
Comparison: None.

CLINICAL DATA: Screening.

EXAM:
DIGITAL SCREENING BILATERAL MAMMOGRAM WITH 3D TOMO WITH CAD

[R MLO]
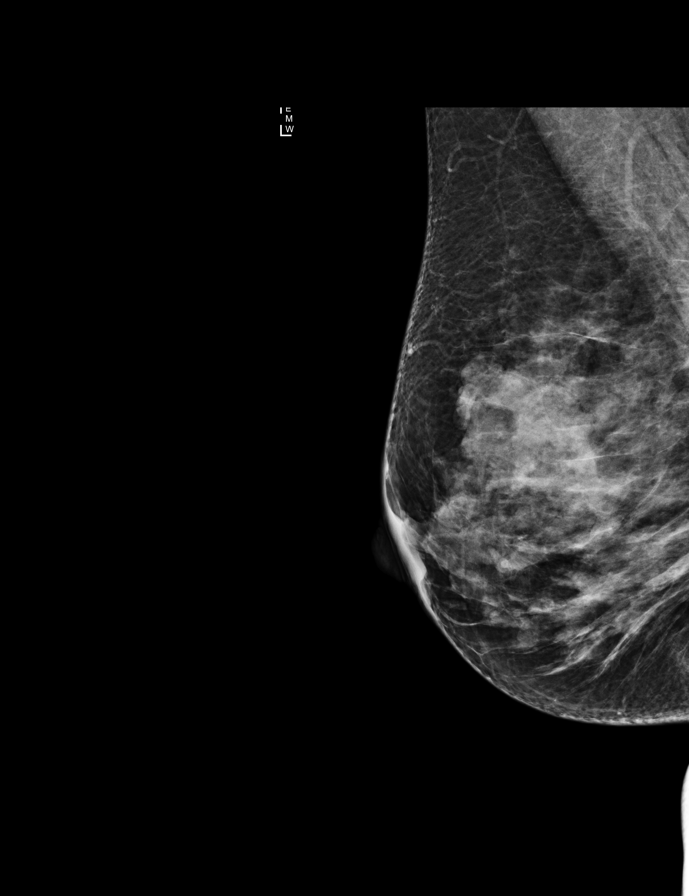

[R CC]
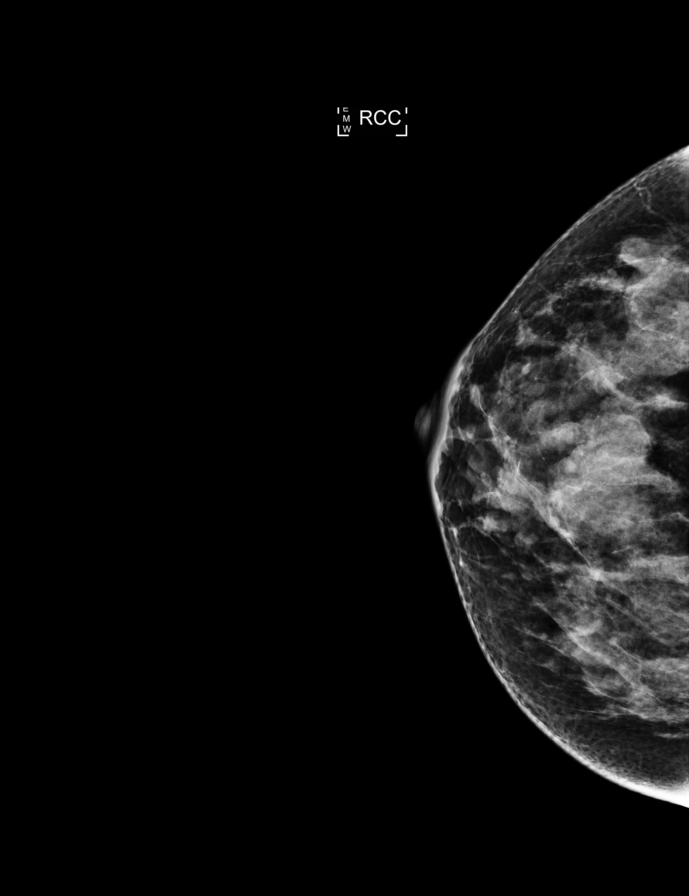

[L CC]
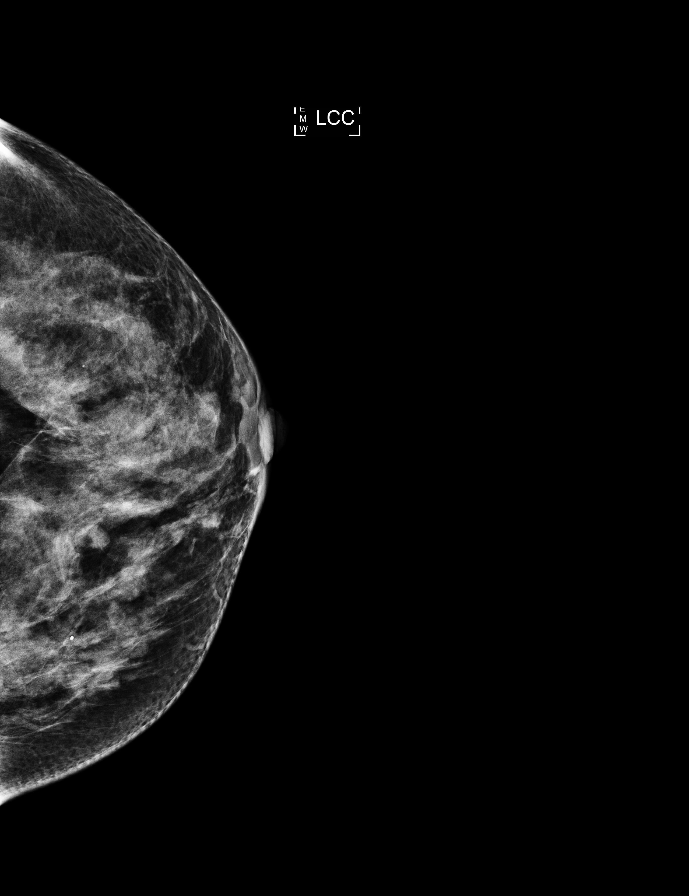

[L MLO]
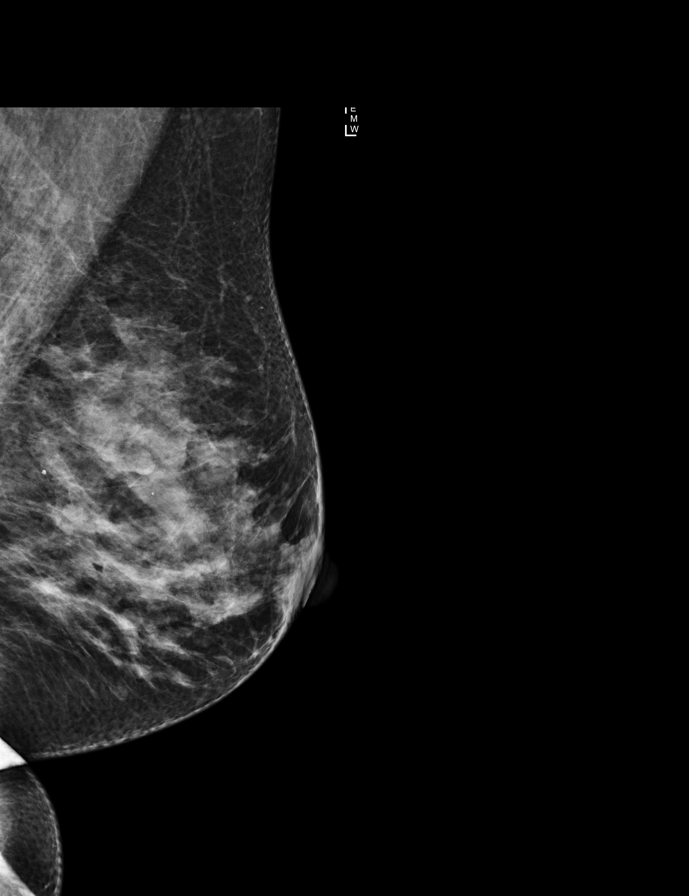

[L MLO tomo · 2 of 69 frames shown]
[frame 23/69]
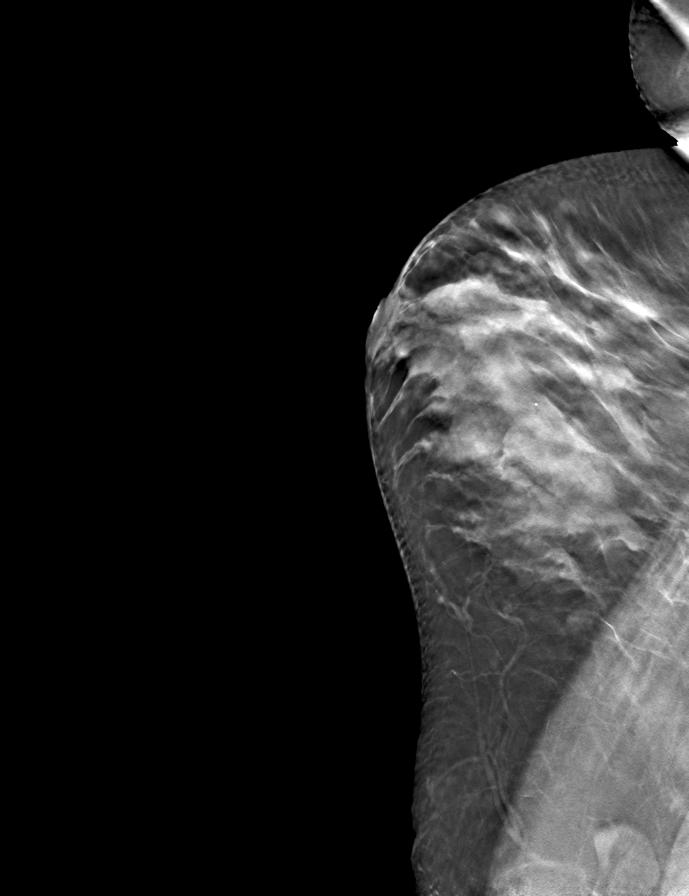
[frame 35/69]
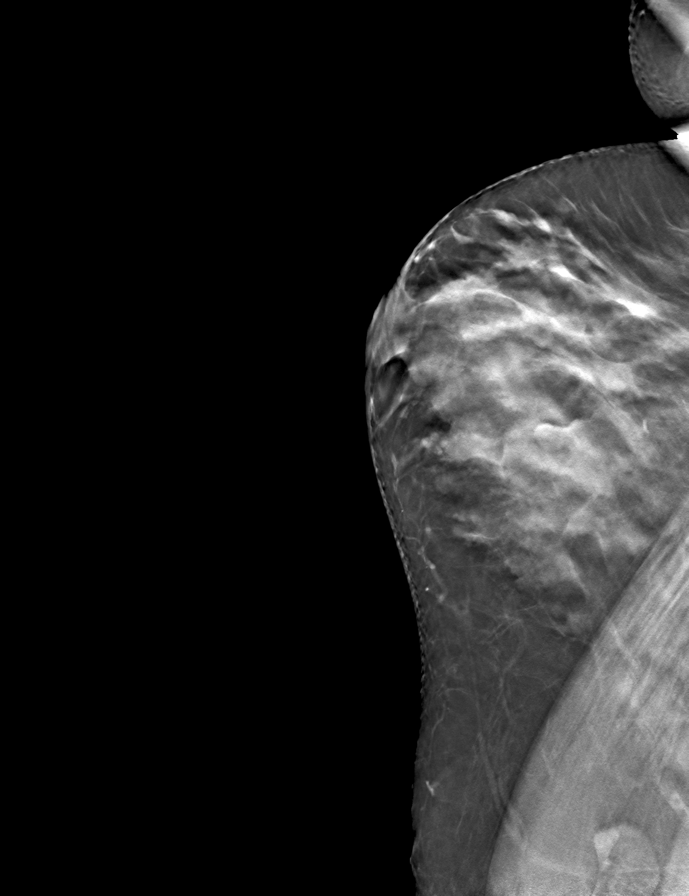

[R MLO tomo · tomo slice 30/59.0]
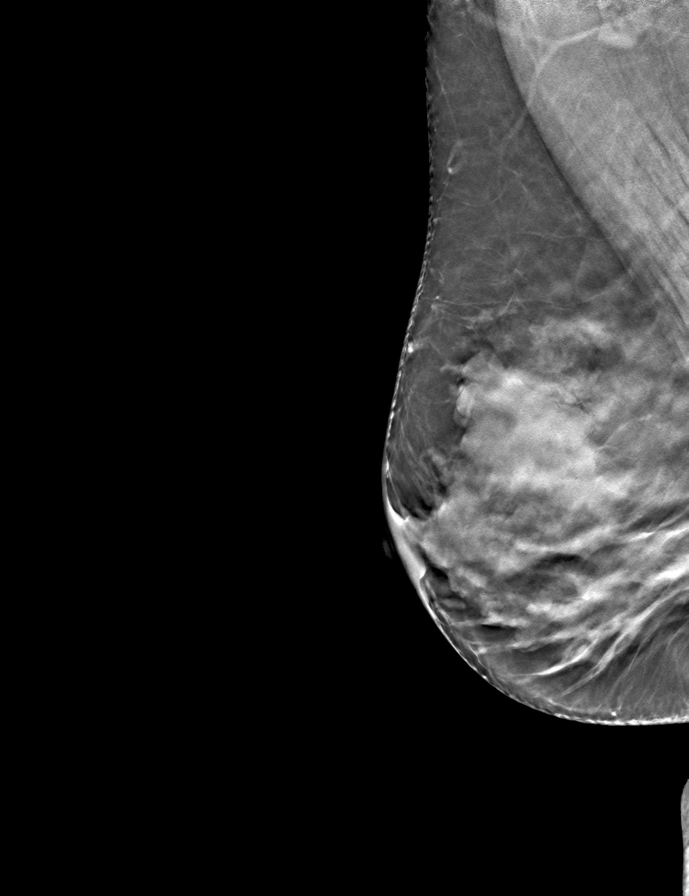

[L CC tomo · tomo slice 29/58.0]
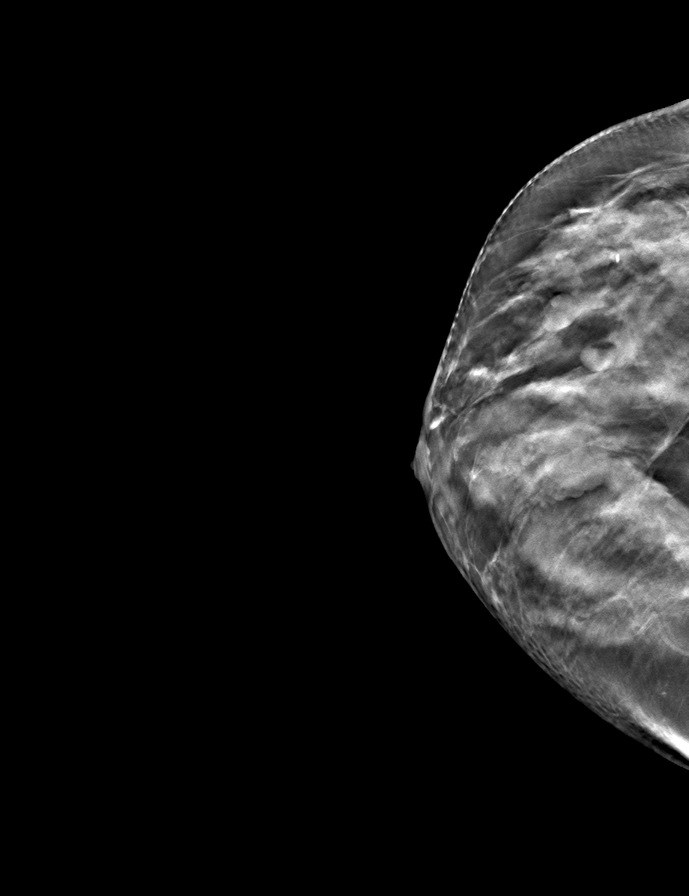

[R CC tomo · tomo slice 26/51.0]
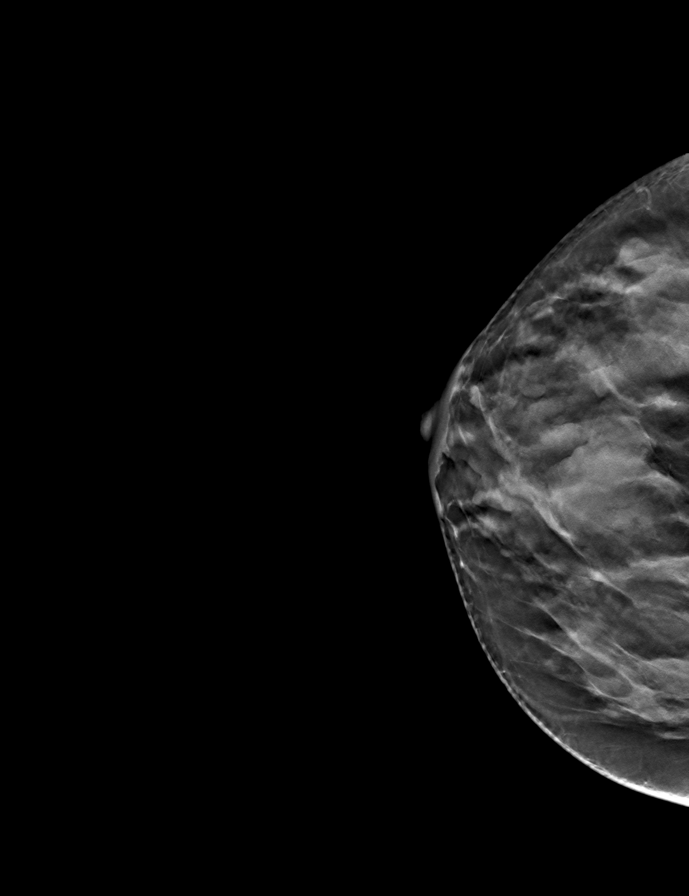

[9 of 24 positions shown; findings below may reference images not displayed]

ACR Breast Density Category c: The breast tissue is heterogeneously
dense, which may obscure small masses
FINDINGS: There are no findings suspicious for malignancy. Images were
processed with CAD.
IMPRESSION: No mammographic evidence of malignancy. A result letter of this
screening mammogram will be mailed directly to the patient.

RECOMMENDATION:
Screening mammogram in one year. (Code:[id number])

BI-RADS CATEGORY  1: Negative.

## 2018-02-15 ENCOUNTER — Ambulatory Visit: Payer: BLUE CROSS/BLUE SHIELD

## 2018-03-13 ENCOUNTER — Other Ambulatory Visit: Payer: Self-pay | Admitting: Family Medicine

## 2018-03-13 DIAGNOSIS — Z0000 Encounter for general adult medical examination without abnormal findings: Secondary | ICD-10-CM

## 2018-05-21 ENCOUNTER — Ambulatory Visit: Payer: BLUE CROSS/BLUE SHIELD | Admitting: Family Medicine

## 2018-05-21 ENCOUNTER — Encounter: Payer: Self-pay | Admitting: Family Medicine

## 2018-05-21 VITALS — BP 110/68 | HR 86 | Temp 97.9°F | Resp 18 | Wt 135.4 lb

## 2018-05-21 DIAGNOSIS — R059 Cough, unspecified: Secondary | ICD-10-CM

## 2018-05-21 DIAGNOSIS — D72829 Elevated white blood cell count, unspecified: Secondary | ICD-10-CM | POA: Diagnosis not present

## 2018-05-21 DIAGNOSIS — Z23 Encounter for immunization: Secondary | ICD-10-CM | POA: Diagnosis not present

## 2018-05-21 DIAGNOSIS — K219 Gastro-esophageal reflux disease without esophagitis: Secondary | ICD-10-CM | POA: Diagnosis not present

## 2018-05-21 DIAGNOSIS — E782 Mixed hyperlipidemia: Secondary | ICD-10-CM | POA: Diagnosis not present

## 2018-05-21 DIAGNOSIS — Z720 Tobacco use: Secondary | ICD-10-CM | POA: Diagnosis not present

## 2018-05-21 DIAGNOSIS — R739 Hyperglycemia, unspecified: Secondary | ICD-10-CM | POA: Diagnosis not present

## 2018-05-21 DIAGNOSIS — R05 Cough: Secondary | ICD-10-CM | POA: Diagnosis not present

## 2018-05-21 DIAGNOSIS — T7840XA Allergy, unspecified, initial encounter: Secondary | ICD-10-CM | POA: Diagnosis not present

## 2018-05-21 MED ORDER — MONTELUKAST SODIUM 10 MG PO TABS: 10.0000 mg | ORAL_TABLET | Freq: Every evening | ORAL | 3 refills | Status: DC | PRN

## 2018-05-21 NOTE — Assessment & Plan Note (Signed)
Avoid offending foods, start probiotics. Do not eat large meals in late evening and consider raising head of bed. Controlled on Ranitidine

## 2018-05-21 NOTE — Patient Instructions (Addendum)
Mucinex twice daily if needed Then can also increase Zyrtec to twice daily if needed Allergies, Adult An allergy is when your body's defense system (immune system) overreacts to an otherwise harmless substance (allergen) that you breathe in or eat or something that touches your skin. When you come into contact with something that you are allergic to, your immune system produces certain proteins (antibodies). These proteins cause cells to release chemicals (histamines) that trigger the symptoms of an allergic reaction. Allergies often affect the nasal passages (allergic rhinitis), eyes (allergic conjunctivitis), skin (atopic dermatitis), and stomach. Allergies can be mild or severe. Allergies cannot spread from person to person (are not contagious). They can develop at any age and may be outgrown. What increases the risk? You may be at greater risk of allergies if other people in your family have allergies. What are the signs or symptoms? Symptoms depend on what type of allergy you have. They may include:  Runny, stuffy nose.  Sneezing.  Itchy mouth, ears, or throat.  Postnasal drip.  Sore throat.  Itchy, red, watery, or puffy eyes.  Skin rash or hives.  Stomach pain.  Vomiting.  Diarrhea.  Bloating.  Wheezing or coughing.  People with a severe allergy to food, medicine, or an insect bite may have a life-threatening allergic reaction (anaphylaxis). Symptoms of anaphylaxis include:  Hives.  Itching.  Flushed face.  Swollen lips, tongue, or mouth.  Tight or swollen throat.  Chest pain or tightness in the chest.  Trouble breathing or shortness of breath.  Rapid heartbeat.  Dizziness or fainting.  Vomiting.  Diarrhea.  Pain in the abdomen.  How is this diagnosed? This condition is diagnosed based on:  Your symptoms.  Your family and medical history.  A physical exam.  You may need to see a health care provider who specializes in treating allergies  (allergist). You may also have tests, including:  Skin tests to see which allergens are causing your symptoms, such as: ? Skin prick test. In this test, your skin is pricked with a tiny needle and exposed to small amounts of possible allergens to see if your skin reacts. ? Intradermal skin test. In this test, a small amount of allergen is injected under your skin to see if your skin reacts. ? Patch test. In this test, a small amount of allergen is placed on your skin and then your skin is covered with a bandage. Your health care provider will check your skin after a couple of days to see if a rash has developed.  Blood tests.  Challenges tests. In this test, you inhale a small amount of allergen by mouth to see if you have an allergic reaction.  You may also be asked to:  Keep a food diary. A food diary is a record of all the foods and drinks you have in a day and any symptoms you experience.  Practice an elimination diet. An elimination diet involves eliminating specific foods from your diet and then adding them back in one by one to find out if a certain food causes an allergic reaction.  How is this treated? Treatment for allergies depends on your symptoms. Treatment may include:  Cold compresses to soothe itching and swelling.  Eye drops.  Nasal sprays.  Using a saline spray or container (neti pot) to flush out the nose (nasal irrigation). These methods can help clear away mucus and keep the nasal passages moist.  Using a humidifier.  Oral antihistamines or other medicines to block allergic  reaction and inflammation.  Skin creams to treat rashes or itching.  Diet changes to eliminate food allergy triggers.  Repeated exposure to tiny amounts of allergens to build up a tolerance and prevent future allergic reactions (immunotherapy). These include: ? Allergy shots. ? Oral treatment. This involves taking small doses of an allergen under the tongue (sublingual  immunotherapy).  Emergency epinephrine injection (auto-injector) in case of an allergic emergency. This is a self-injectable, pre-measured medicine that must be given within the first few minutes of a serious allergic reaction.  Follow these instructions at home:  Avoid known allergens whenever possible.  If you suffer from airborne allergens, wash out your nose daily. You can do this with a saline spray or a neti pot to flush out your nose (nasal irrigation).  Take over-the-counter and prescription medicines only as told by your health care provider.  Keep all follow-up visits as told by your health care provider. This is important.  If you are at risk of a severe allergic reaction (anaphylaxis), keep your auto-injector with you at all times.  If you have ever had anaphylaxis, wear a medical alert bracelet or necklace that states you have a severe allergy. Contact a health care provider if:  Your symptoms do not improve with treatment. Get help right away if:  You have symptoms of anaphylaxis, such as: ? Swollen mouth, tongue, or throat. ? Pain or tightness in your chest. ? Trouble breathing or shortness of breath. ? Dizziness or fainting. ? Severe abdominal pain, vomiting, or diarrhea. This information is not intended to replace advice given to you by your health care provider. Make sure you discuss any questions you have with your health care provider. Document Released: 02/27/2003 Document Revised: 04/04/2017 Document Reviewed: 06/21/2016 Elsevier Interactive Patient Education  [employer].

## 2018-05-21 NOTE — Assessment & Plan Note (Signed)
Encouraged heart healthy diet, increase exercise, avoid trans fats, consider a krill oil cap daily 

## 2018-05-21 NOTE — Assessment & Plan Note (Signed)
Improved

## 2018-05-21 NOTE — Assessment & Plan Note (Signed)
hgba1c acceptable, minimize simple carbs. Increase exercise as tolerated. Continue current meds 

## 2018-05-21 NOTE — Progress Notes (Signed)
Subjective:  I acted as a Neurosurgeon for Dr. Abner Greenspan. [place], [place]  Patient ID: Monica Dillon, female    DOB: 25-Dec-1965, 52 y.o.   MRN: [id number]  No chief complaint on file.   HPI  Patient is in today for 6 month follow up and is noting a good improvement in her cough since quitting smoking in October. She notes she has usually had a sinus infection by now. She is struggling with nasal congestion and sinus pressure from her allergies but no sign of active infection. Her meds are only partially helpful. No other recent concerns or hospitalizations. She is here today to get her second shingles shot. She tolerated the first one well. Denies CP/palp/SOB/HA/congestion/fevers/GI or GU c/o. Taking meds as prescribed  Patient Care Team: Bradd Canary, MD as PCP - General (Family Medicine)   PMH: See EMR  Past Surgical History:  Procedure Laterality Date  . TUBAL LIGATION  "about 20 yrs ago"    Family History  Problem Relation Age of Onset  . Diabetes Maternal Grandmother   . Drug abuse Father   . Cancer Father   . Cirrhosis Sister   . Alcohol abuse Sister   . Diverticulitis Mother   . Cancer Daughter        skin  . Aneurysm Maternal Aunt        brain    Social History   Socioeconomic History  . Marital status: Divorced    Spouse name: Not on file  . Number of children: Not on file  . Years of education: 48  . Highest education level: Not on file  Occupational History  . Occupation: [employer]  Social Needs  . Financial resource strain: Not on file  . Food insecurity:    Worry: Not on file    Inability: Not on file  . Transportation needs:    Medical: Not on file    Non-medical: Not on file  Tobacco Use  . Smoking status: Current Every Day Smoker    Packs/day: 1.00    Years: 30.00    Pack years: 30.00    Types: Cigarettes  . Smokeless tobacco: Never Used  Substance and Sexual Activity  . Alcohol use: Yes    Alcohol/week: 0.0 oz    Comment: occasional  .  Drug use: No  . 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 Physical activity:    Days per week: Not on file    Minutes per session: Not on file  . Stress: Not on file  Relationships  . Social connections:    Talks on phone: Not on file    Gets together: Not on file    Attends religious service: Not on file    Active member of club or organization: Not on file    Attends meetings of clubs or organizations: Not on file    Relationship status: Not on file  . Intimate partner violence:    Fear of current or ex partner: Not on file    Emotionally abused: Not on file    Physically abused: Not on file    Forced sexual activity: Not on file  Other Topics Concern  . Not on file  Social History Narrative  . Not on file    Outpatient Medications Prior to Visit  Medication Sig Dispense Refill  . acetaminophen (TYLENOL) 325 MG tablet Take 650 mg by  mouth every 6 (six) hours as needed for headache.    . albuterol (VENTOLIN HFA) 108 (90 Base) MCG/ACT inhaler Inhale 1-2 puffs into the lungs every 4 (four) hours as needed for wheezing or shortness of breath. 18 g 1  . azelastine (ASTELIN) 0.1 % nasal spray Place 2 sprays into both nostrils 2 (two) times daily. Use in each nostril as directed 30 mL 3  . ranitidine (ZANTAC) 300 MG tablet TAKE 1 TABLET (300 MG TOTAL) BY MOUTH AT BEDTIME. 90 tablet 0  . buPROPion (WELLBUTRIN XL) 150 MG 24 hr tablet Take 1 tablet by mouth daily for 7 days then take 1 tablet by mouth two times daily 60 tablet 2   No facility-administered medications prior to visit.     No Known Allergies  Review of Systems  Constitutional: Negative for fever and malaise/fatigue.  HENT: Positive for congestion.   Eyes: Negative for blurred vision.  Respiratory: Negative for shortness of breath.   Cardiovascular: Negative for chest pain, palpitations and leg swelling.    Gastrointestinal: Negative for abdominal pain, blood in stool and nausea.  Genitourinary: Negative for dysuria and frequency.  Musculoskeletal: Negative for falls.  Skin: Negative for rash.  Neurological: Negative for dizziness, loss of consciousness and headaches.  Endo/Heme/Allergies: Negative for environmental allergies.  Psychiatric/Behavioral: Negative for depression. The patient is not nervous/anxious.        Objective:    Physical Exam  Constitutional: She is oriented to person, place, and time. She appears well-developed and well-nourished. No distress.  HENT:  Head: Normocephalic and atraumatic.  Nose: Nose normal.  Eyes: Right eye exhibits no discharge. Left eye exhibits no discharge.  Neck: Normal range of motion. Neck supple.  Cardiovascular: Normal rate and regular rhythm.  No murmur heard. Pulmonary/Chest: Effort normal and breath sounds normal.  Abdominal: Soft. Bowel sounds are normal. There is no tenderness.  Musculoskeletal: She exhibits no edema.  Neurological: She is alert and oriented to person, place, and time.  Skin: Skin is warm and dry.  Psychiatric: She has a normal mood and affect.  Nursing note and vitals reviewed.   BP 110/68 (BP Location: Left Arm, Patient Position: Sitting, Cuff Size: Normal)   Pulse 86   Temp 97.9 F (36.6 C) (Oral)   Resp 18   Wt 135 lb 6.4 oz (61.4 kg)   SpO2 97%   BMI 26.58 kg/m  Wt Readings from Last 3 Encounters:  05/21/18 135 lb 6.4 oz (61.4 kg)  11/15/17 132 lb 9.6 oz (60.1 kg)  08/18/17 130 lb (59 kg)   BP Readings from Last 3 Encounters:  05/21/18 110/68  11/15/17 119/66  08/18/17 133/79     Immunization History  Administered Date(s) Administered  . Influenza-Unspecified 10/19/2015, 09/23/2016  . Zoster Recombinat (Shingrix) 11/15/2017, 05/21/2018    Health Maintenance  Topic Date Due  . HIV Screening  08/28/1981  . TETANUS/TDAP  08/28/1985  . COLONOSCOPY  08/28/2016  . MAMMOGRAM  02/07/2018  .  INFLUENZA VACCINE  07/18/2018  . PAP SMEAR  10/13/2019    Lab Results  Component Value Date   WBC 8.6 11/15/2017   HGB 13.3 11/15/2017   HCT 40.3 11/15/2017   PLT 382.0 11/15/2017   GLUCOSE 77 11/15/2017   CHOL 227 (H) 11/15/2017   TRIG 197.0 (H) 11/15/2017   HDL 30.90 (L) 11/15/2017   LDLCALC 156 (H) 11/15/2017   ALT 17 11/15/2017   AST 17 11/15/2017   NA 139 11/15/2017   K 4.2 11/15/2017  CL 103 11/15/2017   CREATININE 0.72 11/15/2017   BUN 10 11/15/2017   CO2 31 11/15/2017   TSH 1.15 11/15/2017   HGBA1C 5.7 11/15/2017    Lab Results  Component Value Date   TSH 1.15 11/15/2017   Lab Results  Component Value Date   WBC 8.6 11/15/2017   HGB 13.3 11/15/2017   HCT 40.3 11/15/2017   MCV 90.7 11/15/2017   PLT 382.0 11/15/2017   Lab Results  Component Value Date   NA 139 11/15/2017   K 4.2 11/15/2017   CO2 31 11/15/2017   GLUCOSE 77 11/15/2017   BUN 10 11/15/2017   CREATININE 0.72 11/15/2017   BILITOT 0.3 11/15/2017   ALKPHOS 52 11/15/2017   AST 17 11/15/2017   ALT 17 11/15/2017   PROT 7.2 11/15/2017   ALBUMIN 4.1 11/15/2017   CALCIUM 9.4 11/15/2017   GFR 90.69 11/15/2017   Lab Results  Component Value Date   CHOL 227 (H) 11/15/2017   Lab Results  Component Value Date   HDL 30.90 (L) 11/15/2017   Lab Results  Component Value Date   LDLCALC 156 (H) 11/15/2017   Lab Results  Component Value Date   TRIG 197.0 (H) 11/15/2017   Lab Results  Component Value Date   CHOLHDL 7 11/15/2017   Lab Results  Component Value Date   HGBA1C 5.7 11/15/2017         Assessment & Plan:   Problem List Items Addressed This Visit    Allergy    Is using Astelin and Zyrtec and flonase and still having symptoms. Increase Zyrtec to bid, add nasal saline, singulair and mucinex.       GERD (gastroesophageal reflux disease)    Avoid offending foods, start probiotics. Do not eat large meals in late evening and consider raising head of bed. Controlled on  Ranitidine       Relevant Orders   CBC   TSH   Cough    improved since stopping smoking in October 2018      Tobacco abuse disorder    Encouraged complete cessation. Discussed need to quit as relates to risk of numerous cancers, cardiac and pulmonary disease as well as neurologic complications. Counseled for greater than 3 minutes. Has not had any cigarettes since October 2018      Hyperglycemia    hgba1c acceptable, minimize simple carbs. Increase exercise as tolerated. Continue current meds      Relevant Orders   Hemoglobin A1c   Comprehensive metabolic panel   TSH   Leukocytosis    Improved       Relevant Orders   CBC   TSH   Hyperlipidemia    Encouraged heart healthy diet, increase exercise, avoid trans fats, consider a krill oil cap daily      Relevant Orders   Lipid panel   TSH    Other Visit Diagnoses    Need for shingles vaccine    -  Primary   Relevant Orders   Varicella-zoster vaccine IM (Shingrix) (Completed)      I have discontinued Marquia Bassett's buPROPion. I am also having her start on montelukast. Additionally, I am having her maintain her acetaminophen, azelastine, albuterol, and ranitidine.  Meds ordered this encounter  Medications  . montelukast (SINGULAIR) 10 MG tablet    Sig: Take 1 tablet (10 mg total) by mouth at bedtime as needed.    Dispense:  30 tablet    Refill:  3    CMA served as Neurosurgeonscribe  during this visit. History, Physical and Plan performed by medical provider. Documentation and orders reviewed and attested to.  Penni Homans, MD

## 2018-05-21 NOTE — Assessment & Plan Note (Signed)
Encouraged complete cessation. Discussed need to quit as relates to risk of numerous cancers, cardiac and pulmonary disease as well as neurologic complications. Counseled for greater than 3 minutes. Has not had any cigarettes since October 2018

## 2018-05-22 NOTE — Assessment & Plan Note (Signed)
Is using Astelin and Zyrtec and flonase and still having symptoms. Increase Zyrtec to bid, add nasal saline, singulair and mucinex.

## 2018-05-22 NOTE — Assessment & Plan Note (Signed)
improved since stopping smoking in October 2018

## 2018-10-17 ENCOUNTER — Other Ambulatory Visit (INDEPENDENT_AMBULATORY_CARE_PROVIDER_SITE_OTHER): Payer: BLUE CROSS/BLUE SHIELD

## 2018-10-17 DIAGNOSIS — D72829 Elevated white blood cell count, unspecified: Secondary | ICD-10-CM | POA: Diagnosis not present

## 2018-10-17 DIAGNOSIS — E782 Mixed hyperlipidemia: Secondary | ICD-10-CM

## 2018-10-17 DIAGNOSIS — K219 Gastro-esophageal reflux disease without esophagitis: Secondary | ICD-10-CM

## 2018-10-17 DIAGNOSIS — R739 Hyperglycemia, unspecified: Secondary | ICD-10-CM | POA: Diagnosis not present

## 2018-10-17 LAB — TSH: TSH: 0.83 u[IU]/mL (ref 0.35–4.50)

## 2018-10-17 LAB — COMPREHENSIVE METABOLIC PANEL
ALBUMIN: 4.1 g/dL (ref 3.5–5.2)
ALK PHOS: 50 U/L (ref 39–117)
ALT: 22 U/L (ref 0–35)
AST: 16 U/L (ref 0–37)
BUN: 10 mg/dL (ref 6–23)
CALCIUM: 9.2 mg/dL (ref 8.4–10.5)
CHLORIDE: 104 meq/L (ref 96–112)
CO2: 29 mEq/L (ref 19–32)
Creatinine, Ser: 0.66 mg/dL (ref 0.40–1.20)
GFR: 99.9 mL/min (ref >60.00)
Glucose, Bld: 125 mg/dL — ABNORMAL HIGH (ref 70–99)
POTASSIUM: 4.3 meq/L (ref 3.5–5.1)
Sodium: 139 mEq/L (ref 135–145)
Total Bilirubin: 0.3 mg/dL (ref 0.2–1.2)
Total Protein: 6.7 g/dL (ref 6.0–8.3)

## 2018-10-17 LAB — LDL CHOLESTEROL, DIRECT: Direct LDL: 154 mg/dL

## 2018-10-17 LAB — CBC
HEMATOCRIT: 39.8 % (ref 36.0–46.0)
HEMOGLOBIN: 13.3 g/dL (ref 12.0–15.0)
MCHC: 33.5 g/dL (ref 30.0–36.0)
MCV: 88.7 fl (ref 78.0–100.0)
Platelets: 323 10*3/uL (ref 150.0–400.0)
RBC: 4.49 Mil/uL (ref 3.87–5.11)
RDW: 14.2 % (ref 11.5–15.5)
WBC: 8.6 10*3/uL (ref 4.0–10.5)

## 2018-10-17 LAB — LIPID PANEL
CHOLESTEROL: 193 mg/dL (ref 0–200)
HDL: 28.5 mg/dL — AB (ref >39.00)
NonHDL: 164
Total CHOL/HDL Ratio: 7
Triglycerides: 203 mg/dL — ABNORMAL HIGH (ref 0.0–149.0)
VLDL: 40.6 mg/dL — AB (ref 0.0–40.0)

## 2018-10-17 LAB — HEMOGLOBIN A1C: Hgb A1c MFr Bld: 6.1 % (ref 4.6–6.5)

## 2018-10-21 ENCOUNTER — Ambulatory Visit (INDEPENDENT_AMBULATORY_CARE_PROVIDER_SITE_OTHER): Payer: BLUE CROSS/BLUE SHIELD | Admitting: Family Medicine

## 2018-10-21 ENCOUNTER — Encounter: Payer: Self-pay | Admitting: Family Medicine

## 2018-10-21 VITALS — BP 120/70 | HR 89 | Temp 98.3°F | Resp 16 | Ht 60.0 in | Wt 133.0 lb

## 2018-10-21 DIAGNOSIS — R739 Hyperglycemia, unspecified: Secondary | ICD-10-CM

## 2018-10-21 DIAGNOSIS — Z720 Tobacco use: Secondary | ICD-10-CM

## 2018-10-21 DIAGNOSIS — E785 Hyperlipidemia, unspecified: Secondary | ICD-10-CM

## 2018-10-21 DIAGNOSIS — Z0000 Encounter for general adult medical examination without abnormal findings: Secondary | ICD-10-CM

## 2018-10-21 DIAGNOSIS — K219 Gastro-esophageal reflux disease without esophagitis: Secondary | ICD-10-CM

## 2018-10-21 MED ORDER — CETIRIZINE HCL 10 MG PO TABS: 10.0000 mg | ORAL_TABLET | Freq: Two times a day (BID) | ORAL | 11 refills | Status: DC | PRN

## 2018-10-21 NOTE — Assessment & Plan Note (Signed)
No smoking at this time.

## 2018-10-21 NOTE — Patient Instructions (Signed)
Preventive Care 40-64 Years, Female Preventive care refers to lifestyle choices and visits with your health care provider that can promote health and wellness. What does preventive care include?  A yearly physical exam. This is also called an annual well check.  Dental exams once or twice a year.  Routine eye exams. Ask your health care provider how often you should have your eyes checked.  Personal lifestyle choices, including: ? Daily care of your teeth and gums. ? Regular physical activity. ? Eating a healthy diet. ? Avoiding tobacco and drug use. ? Limiting alcohol use. ? Practicing safe sex. ? Taking low-dose aspirin daily starting at age 58. ? Taking vitamin and mineral supplements as recommended by your health care provider. What happens during an annual well check? The services and screenings done by your health care provider during your annual well check will depend on your age, overall health, lifestyle risk factors, and family history of disease. Counseling Your health care provider may ask you questions about your:  Alcohol use.  Tobacco use.  Drug use.  Emotional well-being.  Home and relationship well-being.  Sexual activity.  Eating habits.  Work and work Statistician.  Method of birth control.  Menstrual cycle.  Pregnancy history.  Screening You may have the following tests or measurements:  Height, weight, and BMI.  Blood pressure.  Lipid and cholesterol levels. These may be checked every 5 years, or more frequently if you are over 81 years old.  Skin check.  Lung cancer screening. You may have this screening every year starting at age 78 if you have a 30-pack-year history of smoking and currently smoke or have quit within the past 15 years.  Fecal occult blood test (FOBT) of the stool. You may have this test every year starting at age 65.  Flexible sigmoidoscopy or colonoscopy. You may have a sigmoidoscopy every 5 years or a colonoscopy  every 10 years starting at age 30.  Hepatitis C blood test.  Hepatitis B blood test.  Sexually transmitted disease (STD) testing.  Diabetes screening. This is done by checking your blood sugar (glucose) after you have not eaten for a while (fasting). You may have this done every 1-3 years.  Mammogram. This may be done every 1-2 years. Talk to your health care provider about when you should start having regular mammograms. This may depend on whether you have a family history of breast cancer.  BRCA-related cancer screening. This may be done if you have a family history of breast, ovarian, tubal, or peritoneal cancers.  Pelvic exam and Pap test. This may be done every 3 years starting at age 80. Starting at age 36, this may be done every 5 years if you have a Pap test in combination with an HPV test.  Bone density scan. This is done to screen for osteoporosis. You may have this scan if you are at high risk for osteoporosis.  Discuss your test results, treatment options, and if necessary, the need for more tests with your health care provider. Vaccines Your health care provider may recommend certain vaccines, such as:  Influenza vaccine. This is recommended every year.  Tetanus, diphtheria, and acellular pertussis (Tdap, Td) vaccine. You may need a Td booster every 10 years.  Varicella vaccine. You may need this if you have not been vaccinated.  Zoster vaccine. You may need this after age 5.  Measles, mumps, and rubella (MMR) vaccine. You may need at least one dose of MMR if you were born in  1957 or later. You may also need a second dose.  Pneumococcal 13-valent conjugate (PCV13) vaccine. You may need this if you have certain conditions and were not previously vaccinated.  Pneumococcal polysaccharide (PPSV23) vaccine. You may need one or two doses if you smoke cigarettes or if you have certain conditions.  Meningococcal vaccine. You may need this if you have certain  conditions.  Hepatitis A vaccine. You may need this if you have certain conditions or if you travel or work in places where you may be exposed to hepatitis A.  Hepatitis B vaccine. You may need this if you have certain conditions or if you travel or work in places where you may be exposed to hepatitis B.  Haemophilus influenzae type b (Hib) vaccine. You may need this if you have certain conditions.  Talk to your health care provider about which screenings and vaccines you need and how often you need them. This information is not intended to replace advice given to you by your health care provider. Make sure you discuss any questions you have with your health care provider. Document Released: 12/31/2015 Document Revised: 08/23/2016 Document Reviewed: 10/05/2015 Elsevier Interactive Patient Education  2018 Elsevier Inc[employer]  

## 2018-10-21 NOTE — Assessment & Plan Note (Signed)
hgba1c acceptable, minimize simple carbs. Increase exercise as tolerated.  

## 2018-10-21 NOTE — Progress Notes (Signed)
Subjective:    Patient ID: Monica Dillon, female    DOB: 05-27-1966, 52 y.o.   MRN: [id number]  Chief Complaint  Patient presents with  . Annual Exam    HPI Patient is in today for annual preventative exam. She feels ewll today, no recent febrile illness or hospitalizations. No acute concerns. Continues to smoke but has cut down. No polyuria or polydipsia.  She is trying to maintain a heart healthy diet or hospitalizations. Denies CP/palp/SOB/HA/congestion/fevers/GI or GU c/o. Taking meds as prescribed    Past Surgical History:  Procedure Laterality Date  . TUBAL LIGATION  "about 20 yrs ago"    Family History  Problem Relation Age of Onset  . Diabetes Maternal Grandmother   . Drug abuse Father   . Cancer Father   . Cirrhosis Sister   . Alcohol abuse Sister   . Diverticulitis Mother   . Cancer Daughter        skin  . Aneurysm Maternal Aunt        brain    Social History   Socioeconomic History  . Marital status: Divorced    Spouse name: Not on file  . Number of children: Not on file  . Years of education: 30  . Highest education level: Not on file  Occupational History  . Occupation: [employer]  Social Needs  . Financial resource strain: Not on file  . Food insecurity:    Worry: Not on file    Inability: Not on file  . Transportation needs:    Medical: Not on file    Non-medical: Not on file  Tobacco Use  . Smoking status: Current Every Day Smoker    Packs/day: 1.00    Years: 30.00    Pack years: 30.00    Types: Cigarettes  . Smokeless tobacco: Never Used  Substance and Sexual Activity  . Alcohol use: Yes    Alcohol/week: 0.0 standard drinks    Comment: occasional  . Drug use: No  . 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 Physical activity:    Days per week: Not on file    Minutes per session: Not on file  . Stress: Not on file    Relationships  . Social connections:    Talks on phone: Not on file    Gets together: Not on file    Attends religious service: Not on file    Active member of club or organization: Not on file    Attends meetings of clubs or organizations: Not on file    Relationship status: Not on file  . Intimate partner violence:    Fear of current or ex partner: Not on file    Emotionally abused: Not on file    Physically abused: Not on file    Forced sexual activity: Not on file  Other Topics Concern  . Not on file  Social History Narrative  . Not on file    Outpatient Medications Prior to Visit  Medication Sig Dispense Refill  . acetaminophen (TYLENOL) 325 MG tablet Take 650 mg by mouth every 6 (six) hours as needed for headache.    . albuterol (VENTOLIN HFA) 108 (90 Base) MCG/ACT inhaler Inhale 1-2 puffs into the lungs every 4 (four) hours as needed for wheezing or shortness of breath. 18 g 1  . azelastine (ASTELIN) 0.1 % nasal spray Place 2 sprays into both  nostrils 2 (two) times daily. Use in each nostril as directed 30 mL 3  . montelukast (SINGULAIR) 10 MG tablet Take 1 tablet (10 mg total) by mouth at bedtime as needed. 30 tablet 3  . ranitidine (ZANTAC) 300 MG tablet TAKE 1 TABLET (300 MG TOTAL) BY MOUTH AT BEDTIME. 90 tablet 0   No facility-administered medications prior to visit.     No Known Allergies  Review of Systems  Constitutional: Negative for chills, fever and malaise/fatigue.  HENT: Negative for congestion and hearing loss.   Eyes: Negative for discharge.  Respiratory: Negative for cough, sputum production and shortness of breath.   Cardiovascular: Negative for chest pain, palpitations and leg swelling.  Gastrointestinal: Negative for abdominal pain, blood in stool, constipation, diarrhea, heartburn, nausea and vomiting.  Genitourinary: Negative for dysuria, frequency, hematuria and urgency.  Musculoskeletal: Negative for back pain, falls and myalgias.  Skin:  Negative for rash.  Neurological: Negative for dizziness, sensory change, loss of consciousness, weakness and headaches.  Endo/Heme/Allergies: Negative for environmental allergies. Does not bruise/bleed easily.  Psychiatric/Behavioral: Negative for depression and suicidal ideas. The patient is not nervous/anxious and does not have insomnia.        Objective:    Physical Exam  Constitutional: She is oriented to person, place, and time. No distress.  HENT:  Head: Normocephalic and atraumatic.  Right Ear: External ear normal.  Left Ear: External ear normal.  Nose: Nose normal.  Mouth/Throat: Oropharynx is clear and moist. No oropharyngeal exudate.  Eyes: Pupils are equal, round, and reactive to light. Conjunctivae are normal. Right eye exhibits no discharge. Left eye exhibits no discharge. No scleral icterus.  Neck: Normal range of motion. Neck supple. No thyromegaly present.  Cardiovascular: Normal rate, regular rhythm, normal heart sounds and intact distal pulses.  No murmur heard. Pulmonary/Chest: Effort normal and breath sounds normal. No respiratory distress. She has no wheezes. She has no rales.  Abdominal: Soft. Bowel sounds are normal. She exhibits no distension and no mass. There is no tenderness.  Musculoskeletal: Normal range of motion. She exhibits no edema or tenderness.  Lymphadenopathy:    She has no cervical adenopathy.  Neurological: She is alert and oriented to person, place, and time. She has normal reflexes. She displays normal reflexes. No cranial nerve deficit. Coordination normal.  Skin: Skin is warm and dry. No rash noted. She is not diaphoretic.    BP 120/70 (BP Location: Left Arm, Patient Position: Sitting, Cuff Size: Small)   Pulse 89   Temp 98.3 F (36.8 C) (Oral)   Resp 16   Ht 5' (1.524 m)   Wt 133 lb (60.3 kg)   SpO2 95%   BMI 25.97 kg/m  Wt Readings from Last 3 Encounters:  10/21/18 133 lb (60.3 kg)  05/21/18 135 lb 6.4 oz (61.4 kg)  11/15/17  132 lb 9.6 oz (60.1 kg)     Lab Results  Component Value Date   WBC 8.6 10/17/2018   HGB 13.3 10/17/2018   HCT 39.8 10/17/2018   PLT 323.0 10/17/2018   GLUCOSE 125 (H) 10/17/2018   CHOL 193 10/17/2018   TRIG 203.0 (H) 10/17/2018   HDL 28.50 (L) 10/17/2018   LDLDIRECT 154.0 10/17/2018   LDLCALC 156 (H) 11/15/2017   ALT 22 10/17/2018   AST 16 10/17/2018   NA 139 10/17/2018   K 4.3 10/17/2018   CL 104 10/17/2018   CREATININE 0.66 10/17/2018   BUN 10 10/17/2018   CO2 29 10/17/2018   TSH 0.83  10/17/2018   HGBA1C 6.1 10/17/2018    Lab Results  Component Value Date   TSH 0.83 10/17/2018   Lab Results  Component Value Date   WBC 8.6 10/17/2018   HGB 13.3 10/17/2018   HCT 39.8 10/17/2018   MCV 88.7 10/17/2018   PLT 323.0 10/17/2018   Lab Results  Component Value Date   NA 139 10/17/2018   K 4.3 10/17/2018   CO2 29 10/17/2018   GLUCOSE 125 (H) 10/17/2018   BUN 10 10/17/2018   CREATININE 0.66 10/17/2018   BILITOT 0.3 10/17/2018   ALKPHOS 50 10/17/2018   AST 16 10/17/2018   ALT 22 10/17/2018   PROT 6.7 10/17/2018   ALBUMIN 4.1 10/17/2018   CALCIUM 9.2 10/17/2018   GFR 99.90 10/17/2018   Lab Results  Component Value Date   CHOL 193 10/17/2018   Lab Results  Component Value Date   HDL 28.50 (L) 10/17/2018   Lab Results  Component Value Date   LDLCALC 156 (H) 11/15/2017   Lab Results  Component Value Date   TRIG 203.0 (H) 10/17/2018   Lab Results  Component Value Date   CHOLHDL 7 10/17/2018   Lab Results  Component Value Date   HGBA1C 6.1 10/17/2018       Assessment & Plan:   Problem List Items Addressed This Visit    Preventative health care    Patient encouraged to maintain heart healthy diet, regular exercise, adequate sleep. Consider daily probiotics. Take medications as prescribed      Relevant Orders   CBC   TSH   GERD (gastroesophageal reflux disease)    Doing well with weight loss and quitting cigarettes      Relevant Orders    CBC   TSH   Tobacco abuse disorder    No smoking at this time.       Hyperglycemia    hgba1c acceptable, minimize simple carbs. Increase exercise as tolerated.      Relevant Orders   Hemoglobin A1c   Comprehensive metabolic panel   Hyperlipidemia - Primary   Relevant Orders   Lipid panel      I have discontinued Vannary Giacobbe's azelastine, albuterol, ranitidine, and montelukast. I am also having her start on cetirizine. Additionally, I am having her maintain her acetaminophen.  Meds ordered this encounter  Medications  . cetirizine (ZYRTEC) 10 MG tablet    Sig: Take 1 tablet (10 mg total) by mouth 2 (two) times daily as needed for allergies.    Dispense:  60 tablet    Refill:  11     Danise Edge, MD

## 2018-10-21 NOTE — Assessment & Plan Note (Signed)
Doing well with weight loss and quitting cigarettes

## 2018-10-21 NOTE — Assessment & Plan Note (Signed)
Patient encouraged to maintain heart healthy diet, regular exercise, adequate sleep. Consider daily probiotics. Take medications as prescribed 

## 2018-10-28 ENCOUNTER — Telehealth: Payer: Self-pay | Admitting: *Deleted

## 2018-10-28 NOTE — Telephone Encounter (Signed)
Received Cologuard Incomplete Order: [id number]; order has been faxed back d/t missing information necessary to send patient a Cologuard kit. [employer] has made several attempts to reach patient by phone and has not been able to reach them; forwarded to provider/SLS 11/11

## 2018-11-20 DIAGNOSIS — Z1211 Encounter for screening for malignant neoplasm of colon: Secondary | ICD-10-CM | POA: Diagnosis not present

## 2018-11-20 DIAGNOSIS — Z1212 Encounter for screening for malignant neoplasm of rectum: Secondary | ICD-10-CM | POA: Diagnosis not present

## 2018-11-20 LAB — COLOGUARD: COLOGUARD: NEGATIVE

## 2018-11-22 LAB — COLOGUARD: COLOGUARD: NEGATIVE

## 2018-12-06 ENCOUNTER — Encounter: Payer: Self-pay | Admitting: Family Medicine

## 2019-03-02 ENCOUNTER — Telehealth: Payer: BLUE CROSS/BLUE SHIELD | Admitting: Family

## 2019-03-02 DIAGNOSIS — J218 Acute bronchiolitis due to other specified organisms: Secondary | ICD-10-CM

## 2019-03-02 DIAGNOSIS — B9789 Other viral agents as the cause of diseases classified elsewhere: Secondary | ICD-10-CM

## 2019-03-02 MED ORDER — PREDNISONE 10 MG (21) PO TBPK: ORAL_TABLET | ORAL | 0 refills | Status: DC

## 2019-03-02 NOTE — Progress Notes (Signed)

## 2019-03-13 ENCOUNTER — Other Ambulatory Visit: Payer: Self-pay | Admitting: Physician Assistant

## 2019-03-13 ENCOUNTER — Telehealth: Payer: BLUE CROSS/BLUE SHIELD | Admitting: Physician Assistant

## 2019-03-13 DIAGNOSIS — K219 Gastro-esophageal reflux disease without esophagitis: Secondary | ICD-10-CM | POA: Diagnosis not present

## 2019-03-13 DIAGNOSIS — B9689 Other specified bacterial agents as the cause of diseases classified elsewhere: Secondary | ICD-10-CM | POA: Diagnosis not present

## 2019-03-13 DIAGNOSIS — J208 Acute bronchitis due to other specified organisms: Principal | ICD-10-CM

## 2019-03-13 MED ORDER — BENZONATATE 100 MG PO CAPS: 100.0000 mg | ORAL_CAPSULE | Freq: Three times a day (TID) | ORAL | 0 refills | Status: DC | PRN

## 2019-03-13 MED ORDER — DOXYCYCLINE HYCLATE 100 MG PO CAPS: 100.0000 mg | ORAL_CAPSULE | Freq: Two times a day (BID) | ORAL | 0 refills | Status: DC

## 2019-03-13 NOTE — Progress Notes (Signed)
I have spent 5 minutes in review of e-visit questionnaire, review and updating patient chart, medical decision making and response to patient.   Gibson Lad Cody Tylan Kinn, PA-C    

## 2019-03-13 NOTE — Progress Notes (Signed)
We are sorry that you are not feeling well.  Here is how we plan to help!  Based on your presentation I believe you most likely have A cough due to bacteria.  When patients have a fever and a productive cough with a change in color or increased sputum production, we are concerned about bacterial bronchitis.  If left untreated it can progress to pneumonia.  If your symptoms do not improve with your treatment plan it is important that you contact your provider.   I have prescribed Doxycycline 100 mg twice a day for 7 days     In addition you may use A prescription cough medication called Tessalon Perles 100mg . You may take 1-2 capsules every 8 hours as needed for your cough.  Start an over-the-counter Prilosec/Omeprazole for reflux for the next 7-14 days.  From your responses in the eVisit questionnaire you describe inflammation in the upper respiratory tract which is causing a significant cough.  This is commonly called Bronchitis and has four common causes:    Allergies  Viral Infections  Acid Reflux  Bacterial Infection Allergies, viruses and acid reflux are treated by controlling symptoms or eliminating the cause. An example might be a cough caused by taking certain blood pressure medications. You stop the cough by changing the medication. Another example might be a cough caused by acid reflux. Controlling the reflux helps control the cough.  USE OF BRONCHODILATOR ("RESCUE") INHALERS: There is a risk from using your bronchodilator too frequently.  The risk is that over-reliance on a medication which only relaxes the muscles surrounding the breathing tubes can reduce the effectiveness of medications prescribed to reduce swelling and congestion of the tubes themselves.  Although you feel brief relief from the bronchodilator inhaler, your asthma may actually be worsening with the tubes becoming more swollen and filled with mucus.  This can delay other crucial treatments, such as oral steroid  medications. If you need to use a bronchodilator inhaler daily, several times per day, you should discuss this with your provider.  There are probably better treatments that could be used to keep your asthma under control.     HOME CARE . Only take medications as instructed by your medical team. . Complete the entire course of an antibiotic. . Drink plenty of fluids and get plenty of rest. . Avoid close contacts especially the very young and the elderly . Cover your mouth if you cough or cough into your sleeve. . Always remember to wash your hands . A steam or ultrasonic humidifier can help congestion.   GET HELP RIGHT AWAY IF: . You develop worsening fever. . You become short of breath . You cough up blood. . Your symptoms persist after you have completed your treatment plan MAKE SURE YOU   Understand these instructions.  Will watch your condition.  Will get help right away if you are not doing well or get worse.  Your e-visit answers were reviewed by a board certified advanced clinical practitioner to complete your personal care plan.  Depending on the condition, your plan could have included both over the counter or prescription medications. If there is a problem please reply  once you have received a response from your provider. Your safety is important to [place].  If you have drug allergies check your prescription carefully.    You can use MyChart to ask questions about today's visit, request a non-urgent call back, or ask for a work or school excuse for 24 hours related to  this e-Visit. If it has been greater than 24 hours you will need to follow up with your provider, or enter a new e-Visit to address those concerns. You will get an e-mail in the next two days asking about your experience.  I hope that your e-visit has been valuable and will speed your recovery. Thank you for using e-visits.

## 2019-03-13 NOTE — Progress Notes (Signed)
Message sent to patient requesting further input regarding current severity of GERD symptoms.   Awaiting patient response.

## 2019-03-14 MED ORDER — LEVOFLOXACIN 500 MG PO TABS: 500.0000 mg | ORAL_TABLET | Freq: Every day | ORAL | 0 refills | Status: DC

## 2019-03-14 NOTE — Addendum Note (Signed)
Addended by: Bennie Pierini on: 03/14/2019 03:54 PM   Modules accepted: Orders

## 2019-03-14 NOTE — Progress Notes (Signed)
rx changed to levaquin- pharmacy out of doxycycline Meds ordered this encounter  Medications  . DISCONTD: benzonatate (TESSALON) 100 MG capsule    Sig: Take 1 capsule (100 mg total) by mouth 3 (three) times daily as needed for cough.    Dispense:  30 capsule    Refill:  0    Order Specific Question:   Supervising Provider    Answer:   Neena Rhymes E [[id number]]  . DISCONTD: doxycycline (VIBRAMYCIN) 100 MG capsule    Sig: Take 1 capsule (100 mg total) by mouth 2 (two) times daily.    Dispense:  14 capsule    Refill:  0    Order Specific Question:   Supervising Provider    Answer:   Neena Rhymes E [[id number]]  . levofloxacin (LEVAQUIN) 500 MG tablet    Sig: Take 1 tablet (500 mg total) by mouth daily.    Dispense:  7 tablet    Refill:  0    Order Specific Question:   Supervising Provider    Answer:   Eber Hong [[id number]]

## 2019-04-07 ENCOUNTER — Ambulatory Visit (INDEPENDENT_AMBULATORY_CARE_PROVIDER_SITE_OTHER): Payer: BLUE CROSS/BLUE SHIELD | Admitting: Medical

## 2019-04-07 ENCOUNTER — Encounter: Payer: Self-pay | Admitting: Medical

## 2019-04-07 DIAGNOSIS — J029 Acute pharyngitis, unspecified: Secondary | ICD-10-CM

## 2019-04-07 DIAGNOSIS — J40 Bronchitis, not specified as acute or chronic: Secondary | ICD-10-CM

## 2019-04-07 DIAGNOSIS — R059 Cough, unspecified: Secondary | ICD-10-CM

## 2019-04-07 DIAGNOSIS — R0600 Dyspnea, unspecified: Secondary | ICD-10-CM

## 2019-04-07 DIAGNOSIS — R05 Cough: Secondary | ICD-10-CM

## 2019-04-07 MED ORDER — ALBUTEROL SULFATE HFA 108 (90 BASE) MCG/ACT IN AERS: 2.0000 | INHALATION_SPRAY | Freq: Four times a day (QID) | RESPIRATORY_TRACT | 2 refills | Status: DC | PRN

## 2019-04-07 MED ORDER — AZITHROMYCIN 250 MG PO TABS: ORAL_TABLET | ORAL | 0 refills | Status: DC

## 2019-04-07 MED ORDER — PREDNISONE 10 MG (21) PO TBPK: ORAL_TABLET | ORAL | 0 refills | Status: DC

## 2019-04-07 MED ORDER — HYDROCODONE-HOMATROPINE 5-1.5 MG/5ML PO SYRP: 5.0000 mL | ORAL_SOLUTION | Freq: Four times a day (QID) | ORAL | 0 refills | Status: DC | PRN

## 2019-04-07 NOTE — Patient Instructions (Addendum)
Patient does have some signs and symptoms of probable bronchitis presently with some mild-moderate dyspnea when she walks around her house.  Constant intermittent cough as well.  No leg swelling, no popliteal pain and no calf pain.  Her oxygen sat is 90% recently.  This is with an app on her phone which she is never used before.  I am unclear the accuracy of this particular.  She does have history of smoking for 30 years or more.  She stopped smoking last year.  Patient is not had any fever.  Mild sore throat recently as well.  Will prescribe a azithromycin antibiotic for bronchitis and pharyngitis.  For cough, making Hycodan available.  Hopefully she will stop coughing and be able to sleep.  For dyspnea and history of smoking, I did go ahead and prescribe 6-day taper dose of prednisone and albuterol inhaler.  Advised to go ahead and start 6-day taper dose today and for the next 24 hours use 2 puffs of albuterol every 6 hours.  Use her app to see how her oxygen sat is on the phone.  Explained to patient sat less than 90 is not good and should not occur with the use of prednisone and albuterol.  Update me by my chart or calling the office tomorrow.  If oxygen if not improving then patient might need chest x-ray, labs and have to weigh the benefit versus risk of her being evaluated in the emergency department.  Explained to patient hopefully that this dyspnea is just reactive airway flare as opposed to other serious illness such as pneumonia or COVID.  Patient has had no fever recently and she has been in shelter get home for the last month except for once a week trip to grocery store.  Wait for her update tomorrow but also want her to have a virtual visit on Wednesday as well.

## 2019-04-07 NOTE — Progress Notes (Signed)
Subjective:    Patient ID: Monica Dillon, female    DOB: 12-15-1966, 53 y.o.   MRN: [id number]  HPI  Virtual Visit via Video Note  I connected with Monica Labonya Goucher on 04/07/19 at 11:00 AM EDT by a video enabled telemedicine application and verified that I am speaking with the correct person using two identifiers.   I discussed the limitations of evaluation and management by telemedicine and the availability of in person appointments. The patient expressed understanding and agreed to proceed.   History of Present Illness:  Pt states one month ago she had cough which she eventually got better.  Pt just recently state st, fatigue, submandibular node tenderness and pain swollowing. Over last 3 days she has gotten cough moderate to severe. She coughs during entire. Pt states feels mild shortness of breath. Pt is bringing colored mucus.  Usually this time of year she gets some sinus symptoms. A month ago she had nasal congestion and cough. She got prednisone and antibiotic then got better.  Pt has been working at [employer]. Pt has been sheltering except for going to store about one time a week.(pt got levaquin and predniosne last month).  Pt quit smoking last year.  Pt has been taking her temp and no fever. No T over 100.  Yesterday she felt very tired and slept all day.  Pt has 02 sat monitor health app. She states her 0xygen sat 90% this is first time she checked that app.    Observations/Objective: No acute distress but she does cough during the entire exam.  Does not appear to have labored breathing presently.  Assessment and Plan: Patient does have some signs and symptoms of probable bronchitis presently with some mild-moderate dyspnea when she walks around her house.  Constant intermittent cough as well.  No leg swelling, no popliteal pain and no calf pain.  Her oxygen sat is 90% recently.  This is with an app on her phone which she is never used before.  I am unclear the accuracy  of this particular.  She does have history of smoking for 30 years or more.  She stopped smoking last year.  Patient is not had any fever.  Mild sore throat recently as well.  Will prescribe a azithromycin antibiotic for bronchitis and pharyngitis.  For cough, making Hycodan available.  Hopefully she will stop coughing and be able to sleep.  For dyspnea and history of smoking, I did go ahead and prescribe 6-day taper dose of prednisone and albuterol inhaler.  Advised to go ahead and start 6-day taper dose today and for the next 24 hours use 2 puffs of albuterol every 6 hours.  Use her app to see how her oxygen sat is on the phone.  Explained to patient sat less than 90 is not good and should not occur with the use of prednisone and albuterol.  Update me by my chart or calling the office tomorrow.  If oxygen if not improving then patient might need chest x-ray, labs and have to weigh the benefit versus risk of her being evaluated in the emergency department.  Explained to patient hopefully that this dyspnea is just reactive airway flare as opposed to other serious illness such as pneumonia or COVID.  Patient has had no fever recently and she has been in shelter get home for the last month except for once a week trip to grocery store.  Wait for her update tomorrow but also want her to have a virtual visit  on Wednesday as well.  Follow Up Instructions:    I discussed the assessment and treatment plan with the patient. The patient was provided an opportunity to ask questions and all were answered. The patient agreed with the plan and demonstrated an understanding of the instructions.   The patient was advised to call back or seek an in-person evaluation if the symptoms worsen or if the condition fails to improve as anticipated.     Esperanza Richters, PA-C    Review of Systems  Constitutional: Positive for fatigue.  HENT: Negative for dental problem, postnasal drip, sinus pressure and sore throat.    Respiratory: Positive for cough and shortness of breath. Negative for wheezing.   Cardiovascular: Negative for chest pain and palpitations.  Musculoskeletal:       No leg pain. No calf pain. No calf asmetry.  Skin: Negative for rash.  Neurological: Negative for dizziness, seizures, weakness and headaches.  Hematological: Negative for adenopathy. Does not bruise/bleed easily.  Psychiatric/Behavioral: Negative for behavioral problems and confusion.      Social History   Socioeconomic History  . Marital status: Divorced    Spouse name: Not on file  . Number of children: Not on file  . Years of education: 46  . Highest education level: Not on file  Occupational History  . Occupation: [employer]  Social Needs  . Financial resource strain: Not on file  . Food insecurity:    Worry: Not on file    Inability: Not on file  . Transportation needs:    Medical: Not on file    Non-medical: Not on file  Tobacco Use  . Smoking status: Current Every Day Smoker    Packs/day: 1.00    Years: 30.00    Pack years: 30.00    Types: Cigarettes  . Smokeless tobacco: Never Used  Substance and Sexual Activity  . Alcohol use: Yes    Alcohol/week: 0.0 standard drinks    Comment: occasional  . Drug use: No  . 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 Physical activity:    Days per week: Not on file    Minutes per session: Not on file  . Stress: Not on file  Relationships  . Social connections:    Talks on phone: Not on file    Gets together: Not on file    Attends religious service: Not on file    Active member of club or organization: Not on file    Attends meetings of clubs or organizations: Not on file    Relationship status: Not on file  . Intimate partner violence:    Fear of current or ex partner: Not on file    Emotionally abused: Not on file    Physically abused: Not  on file    Forced sexual activity: Not on file  Other Topics Concern  . Not on file  Social History Narrative  . Not on file    Past Surgical History:  Procedure Laterality Date  . TUBAL LIGATION  "about 20 yrs ago"    Family History  Problem Relation Age of Onset  . Diabetes Maternal Grandmother   . Drug abuse Father   . Cancer Father   . Cirrhosis Sister   . Alcohol abuse Sister   . Diverticulitis Mother   . Cancer Daughter        skin  . Aneurysm Maternal  Aunt        brain    No Known Allergies  Current Outpatient Medications on File Prior to Visit  Medication Sig Dispense Refill  . acetaminophen (TYLENOL) 325 MG tablet Take 650 mg by mouth every 6 (six) hours as needed for headache.    . benzonatate (TESSALON) 200 MG capsule Take 1 capsule (200 mg total) by mouth 3 (three) times daily as needed for cough. 30 capsule 0  . cetirizine (ZYRTEC) 10 MG tablet Take 1 tablet (10 mg total) by mouth 2 (two) times daily as needed for allergies. 60 tablet 11  . levofloxacin (LEVAQUIN) 500 MG tablet Take 1 tablet (500 mg total) by mouth daily. 7 tablet 0   No current facility-administered medications on file prior to visit.     There were no vitals taken for this visit.      Objective:   Physical Exam See objective.       Assessment & Plan:

## 2019-04-08 ENCOUNTER — Encounter: Payer: Self-pay | Admitting: Medical

## 2019-04-09 ENCOUNTER — Other Ambulatory Visit: Payer: Self-pay

## 2019-04-09 ENCOUNTER — Ambulatory Visit (INDEPENDENT_AMBULATORY_CARE_PROVIDER_SITE_OTHER): Payer: BLUE CROSS/BLUE SHIELD | Admitting: Medical

## 2019-04-09 DIAGNOSIS — R0600 Dyspnea, unspecified: Secondary | ICD-10-CM | POA: Diagnosis not present

## 2019-04-09 DIAGNOSIS — R05 Cough: Secondary | ICD-10-CM

## 2019-04-09 DIAGNOSIS — J40 Bronchitis, not specified as acute or chronic: Secondary | ICD-10-CM

## 2019-04-09 DIAGNOSIS — R059 Cough, unspecified: Secondary | ICD-10-CM

## 2019-04-09 NOTE — Patient Instructions (Signed)
Patient's part of bronchitis symptoms and sore throat have improved a lot.  Her oxygen saturations are very good today.  In fact she reports getting 97% level today.  Want her to continue the azithromycin antibiotic.  For prior dyspnea and history of smoking, continue with taper prednisone and albuterol.  Combination seems to have helped a lot.  For cough, she does have Hycodan and is using this if needed.  Patient's not had any fever and at this point my suspicion for her COVID infection is low based on her clinical response to the above treatment.  Asked her to giving update on Friday as to how she is doing.  If her slight shortness of breath worsens as she finishes last dose of taper prednisone that might extend prednisone over the weekend.  If she were to have any fever or clinically worsen then would consider chest x-ray and possible arranging for COVID testing however presently I am very optimistic that she will continue to improve.  Follow-up as needed at this point.

## 2019-04-09 NOTE — Progress Notes (Signed)
   Subjective:    Patient ID: Kawehi Rogan, female    DOB: 1966-05-19, 53 y.o.   MRN: [id number]  HPI Virtual Visit via Video Note  I connected with [name] on 04/09/19 at  9:00 AM EDT by a video enabled telemedicine application and verified that I am speaking with the correct person using two identifiers.   I discussed the limitations of evaluation and management by telemedicine and the availability of in person appointments. The patient expressed understanding and agreed to proceed.   History of Present Illness:  Pt states she has been checking her o2 sat and today has been between 94-97%. Pt states energy level is much better. Pt mucus was deeper yellow/green and now it is clearer in color. Pt states will hold off on cough meds during day and let herself cough to clear mucus. Pt states she is not wheezing. She is using albuterol about 3 times a day.using more as precaution rather than necessity.  Pt is on day 3 of antibiotic. Has taper prednisone, hycodan, and albuterol.   Pt not been having any fevers.      Observations/Objective: No acute distress.   Assessment and Plan: Patient's part of bronchitis symptoms and sore throat have improved a lot.  Her oxygen saturations are very good today.  In fact she reports getting 97% level today.  Want her to continue the azithromycin antibiotic.  For prior dyspnea and history of smoking, continue with taper prednisone and albuterol.  Combination seems to have helped a lot.  For cough, she does have Hycodan and is using this if needed.  Patient's not had any fever and at this point my suspicion for her COVID infection is low based on her clinical response to the above treatment.  Asked her to giving update on Friday as to how she is doing.  If her slight shortness of breath worsens as she finishes last dose of taper prednisone that might extend prednisone over the weekend.  If she were to have any fever or clinically worsen then would  consider chest x-ray and possible arranging for COVID testing however presently I am very optimistic that she will continue to improve.  Follow-up as needed at this point.  Follow Up Instructions:    I discussed the assessment and treatment plan with the patient. The patient was provided an opportunity to ask questions and all were answered. The patient agreed with the plan and demonstrated an understanding of the instructions.   The patient was advised to call back or seek an in-person evaluation if the symptoms worsen or if the condition fails to improve as anticipated.    Esperanza Richters, PA-C    Review of Systems  Constitutional: Negative for chills, fatigue and fever.  HENT: Positive for congestion. Negative for ear pain, sinus pressure, sinus pain and sore throat.   Respiratory: Positive for cough and shortness of breath. Negative for chest tightness and wheezing.        See hpi both much better,  Cardiovascular: Negative for chest pain and palpitations.  Gastrointestinal: Negative for abdominal pain.  Musculoskeletal: Negative for back pain, neck pain and neck stiffness.  Skin: Negative for rash.  Neurological: Negative for dizziness and headaches.  Hematological: Negative for adenopathy. Does not bruise/bleed easily.  Psychiatric/Behavioral: Negative for behavioral problems and confusion.            Objective:   Physical Exam  See objective.      Assessment & Plan:

## 2019-04-11 ENCOUNTER — Ambulatory Visit (HOSPITAL_BASED_OUTPATIENT_CLINIC_OR_DEPARTMENT_OTHER)
Admission: RE | Admit: 2019-04-11 | Discharge: 2019-04-11 | Disposition: A | Discharge status: Home / Self Care | Payer: BLUE CROSS/BLUE SHIELD | Source: Ambulatory Visit | Attending: Medical | Admitting: Medical

## 2019-04-11 ENCOUNTER — Other Ambulatory Visit: Payer: Self-pay

## 2019-04-11 ENCOUNTER — Encounter: Payer: Self-pay | Admitting: Medical

## 2019-04-11 ENCOUNTER — Telehealth: Payer: Self-pay | Admitting: Medical

## 2019-04-11 DIAGNOSIS — R059 Cough, unspecified: Secondary | ICD-10-CM

## 2019-04-11 DIAGNOSIS — R05 Cough: Secondary | ICD-10-CM | POA: Diagnosis not present

## 2019-04-11 NOTE — Telephone Encounter (Signed)
I am sending pt my chart message which I assume she will see. But would you also try to call her and explain that I do want her to get chest xray today. Will do since symptoms persist. Remind her to wear mask.

## 2019-04-14 ENCOUNTER — Encounter: Payer: Self-pay | Admitting: Medical

## 2019-04-17 ENCOUNTER — Other Ambulatory Visit: Payer: BLUE CROSS/BLUE SHIELD

## 2019-04-21 ENCOUNTER — Other Ambulatory Visit: Payer: Self-pay

## 2019-04-21 ENCOUNTER — Telehealth: Payer: BLUE CROSS/BLUE SHIELD | Admitting: Family Medicine

## 2019-04-26 NOTE — Progress Notes (Signed)
Greater than 5 minutes, yet less than 10 minutes of time have been spent researching, coordinating, and implementing care for this patient today.  Thank you for the details you included in the comment boxes. Those details are very helpful in determining the best course of treatment for you and help us to provide the best care.  

## 2019-10-08 ENCOUNTER — Encounter: Payer: Self-pay | Admitting: Family Medicine

## 2019-10-22 ENCOUNTER — Other Ambulatory Visit: Payer: Self-pay

## 2019-10-23 ENCOUNTER — Encounter: Payer: BLUE CROSS/BLUE SHIELD | Admitting: Family Medicine

## 2019-10-23 ENCOUNTER — Other Ambulatory Visit (HOSPITAL_COMMUNITY)
Admission: RE | Admit: 2019-10-23 | Discharge: 2019-10-23 | Disposition: A | Discharge status: Home / Self Care | Payer: BC Managed Care – PPO | Source: Ambulatory Visit | Attending: Family Medicine | Admitting: Family Medicine

## 2019-10-23 ENCOUNTER — Ambulatory Visit (HOSPITAL_BASED_OUTPATIENT_CLINIC_OR_DEPARTMENT_OTHER)
Admission: RE | Admit: 2019-10-23 | Discharge: 2019-10-23 | Disposition: A | Discharge status: Home / Self Care | Payer: BC Managed Care – PPO | Source: Ambulatory Visit | Attending: Family Medicine | Admitting: Family Medicine

## 2019-10-23 ENCOUNTER — Ambulatory Visit (INDEPENDENT_AMBULATORY_CARE_PROVIDER_SITE_OTHER): Payer: BC Managed Care – PPO | Admitting: Family Medicine

## 2019-10-23 ENCOUNTER — Other Ambulatory Visit: Payer: Self-pay

## 2019-10-23 ENCOUNTER — Other Ambulatory Visit: Payer: Self-pay | Admitting: Family Medicine

## 2019-10-23 VITALS — BP 110/72 | HR 80 | Temp 98.5°F | Resp 18 | Wt 128.8 lb

## 2019-10-23 DIAGNOSIS — Z1231 Encounter for screening mammogram for malignant neoplasm of breast: Secondary | ICD-10-CM | POA: Diagnosis not present

## 2019-10-23 DIAGNOSIS — Z0000 Encounter for general adult medical examination without abnormal findings: Secondary | ICD-10-CM | POA: Diagnosis not present

## 2019-10-23 DIAGNOSIS — G4700 Insomnia, unspecified: Secondary | ICD-10-CM

## 2019-10-23 DIAGNOSIS — Z124 Encounter for screening for malignant neoplasm of cervix: Secondary | ICD-10-CM | POA: Insufficient documentation

## 2019-10-23 DIAGNOSIS — E782 Mixed hyperlipidemia: Secondary | ICD-10-CM

## 2019-10-23 DIAGNOSIS — R739 Hyperglycemia, unspecified: Secondary | ICD-10-CM

## 2019-10-23 MED ORDER — ZOLPIDEM TARTRATE 5 MG PO TABS: 5.0000 mg | ORAL_TABLET | Freq: Every evening | ORAL | 3 refills | Status: DC | PRN

## 2019-10-23 NOTE — Patient Instructions (Addendum)
Weekly vitals  Multivitamin with mineral selenium, zinc, vitamin D and C Sarna lotion, benadryl cream, hydrocortisone cream  Preventive Care 76-53 Years Old, Female Preventive care refers to visits with your health care provider and lifestyle choices that can promote health and wellness. This includes:  A yearly physical exam. This may also be called an annual well check.  Regular dental visits and eye exams.  Immunizations.  Screening for certain conditions.  Healthy lifestyle choices, such as eating a healthy diet, getting regular exercise, not using drugs or products that contain nicotine and tobacco, and limiting alcohol use. What can I expect for my preventive care visit? Physical exam Your health care provider will check your:  Height and weight. This may be used to calculate body mass index (BMI), which tells if you are at a healthy weight.  Heart rate and blood pressure.  Skin for abnormal spots. Counseling Your health care provider may ask you questions about your:  Alcohol, tobacco, and drug use.  Emotional well-being.  Home and relationship well-being.  Sexual activity.  Eating habits.  Work and work Statistician.  Method of birth control.  Menstrual cycle.  Pregnancy history. What immunizations do I need?  Influenza (flu) vaccine  This is recommended every year. Tetanus, diphtheria, and pertussis (Tdap) vaccine  You may need a Td booster every 10 years. Varicella (chickenpox) vaccine  You may need this if you have not been vaccinated. Zoster (shingles) vaccine  You may need this after age 7. Measles, mumps, and rubella (MMR) vaccine  You may need at least one dose of MMR if you were born in 1957 or later. You may also need a second dose. Pneumococcal conjugate (PCV13) vaccine  You may need this if you have certain conditions and were not previously vaccinated. Pneumococcal polysaccharide (PPSV23) vaccine  You may need one or two doses if  you smoke cigarettes or if you have certain conditions. Meningococcal conjugate (MenACWY) vaccine  You may need this if you have certain conditions. Hepatitis A vaccine  You may need this if you have certain conditions or if you travel or work in places where you may be exposed to hepatitis A. Hepatitis B vaccine  You may need this if you have certain conditions or if you travel or work in places where you may be exposed to hepatitis B. Haemophilus influenzae type b (Hib) vaccine  You may need this if you have certain conditions. Human papillomavirus (HPV) vaccine  If recommended by your health care provider, you may need three doses over 6 months. You may receive vaccines as individual doses or as more than one vaccine together in one shot (combination vaccines). Talk with your health care provider about the risks and benefits of combination vaccines. What tests do I need? Blood tests  Lipid and cholesterol levels. These may be checked every 5 years, or more frequently if you are over 79 years old.  Hepatitis C test.  Hepatitis B test. Screening  Lung cancer screening. You may have this screening every year starting at age 46 if you have a 30-pack-year history of smoking and currently smoke or have quit within the past 15 years.  Colorectal cancer screening. All adults should have this screening starting at age 35 and continuing until age 64. Your health care provider may recommend screening at age 36 if you are at increased risk. You will have tests every 1-10 years, depending on your results and the type of screening test.  Diabetes screening. This is done  by checking your blood sugar (glucose) after you have not eaten for a while (fasting). You may have this done every 1-3 years.  Mammogram. This may be done every 1-2 years. Talk with your health care provider about when you should start having regular mammograms. This may depend on whether you have a family history of breast  cancer.  BRCA-related cancer screening. This may be done if you have a family history of breast, ovarian, tubal, or peritoneal cancers.  Pelvic exam and Pap test. This may be done every 3 years starting at age 26. Starting at age 76, this may be done every 5 years if you have a Pap test in combination with an HPV test. Other tests  Sexually transmitted disease (STD) testing.  Bone density scan. This is done to screen for osteoporosis. You may have this scan if you are at high risk for osteoporosis. Follow these instructions at home: Eating and drinking  Eat a diet that includes fresh fruits and vegetables, whole grains, lean protein, and low-fat dairy.  Take vitamin and mineral supplements as recommended by your health care provider.  Do not drink alcohol if: ? Your health care provider tells you not to drink. ? You are pregnant, may be pregnant, or are planning to become pregnant.  If you drink alcohol: ? Limit how much you have to 0-1 drink a day. ? Be aware of how much alcohol is in your drink. In the [place] one drink equals one 12 oz bottle of beer (355 mL), one 5 oz glass of wine (148 mL), or one 1 oz glass of hard liquor (44 mL). Lifestyle  Take daily care of your teeth and gums.  Stay active. Exercise for at least 30 minutes on 5 or more days each week.  Do not use any products that contain nicotine or tobacco, such as cigarettes, e-cigarettes, and chewing tobacco. If you need help quitting, ask your health care provider.  If you are sexually active, practice safe sex. Use a condom or other form of birth control (contraception) in order to prevent pregnancy and STIs (sexually transmitted infections).  If told by your health care provider, take low-dose aspirin daily starting at age 55. What's next?  Visit your health care provider once a year for a well check visit.  Ask your health care provider how often you should have your eyes and teeth checked.  Stay up to date  on all vaccines. This information is not intended to replace advice given to you by your health care provider. Make sure you discuss any questions you have with your health care provider. Document Released: 12/31/2015 Document Revised: 08/15/2018 Document Reviewed: 08/15/2018 Elsevier Patient Education  2020 [employer].

## 2019-10-24 LAB — CBC
HCT: 44.6 % (ref 36.0–46.0)
Hemoglobin: 15.2 g/dL — ABNORMAL HIGH (ref 12.0–15.0)
MCHC: 34.1 g/dL (ref 30.0–36.0)
MCV: 93.8 fl (ref 78.0–100.0)
Platelets: 344 10*3/uL (ref 150.0–400.0)
RBC: 4.76 Mil/uL (ref 3.87–5.11)
RDW: 12.6 % (ref 11.5–15.5)
WBC: 10.7 10*3/uL — ABNORMAL HIGH (ref 4.0–10.5)

## 2019-10-24 LAB — COMPREHENSIVE METABOLIC PANEL
ALT: 22 U/L (ref 0–35)
AST: 16 U/L (ref 0–37)
Albumin: 4.6 g/dL (ref 3.5–5.2)
Alkaline Phosphatase: 65 U/L (ref 39–117)
BUN: 9 mg/dL (ref 6–23)
CO2: 31 mEq/L (ref 19–32)
Calcium: 9.6 mg/dL (ref 8.4–10.5)
Chloride: 101 mEq/L (ref 96–112)
Creatinine, Ser: 0.63 mg/dL (ref 0.40–1.20)
GFR: 98.79 mL/min (ref >60.00)
Glucose, Bld: 91 mg/dL (ref 70–99)
Potassium: 4.7 mEq/L (ref 3.5–5.1)
Sodium: 141 mEq/L (ref 135–145)
Total Bilirubin: 0.4 mg/dL (ref 0.2–1.2)
Total Protein: 7.4 g/dL (ref 6.0–8.3)

## 2019-10-24 LAB — LIPID PANEL
Cholesterol: 248 mg/dL — ABNORMAL HIGH (ref 0–200)
HDL: 32.4 mg/dL — ABNORMAL LOW (ref >39.00)
NonHDL: 215.67
Total CHOL/HDL Ratio: 8
Triglycerides: 223 mg/dL — ABNORMAL HIGH (ref 0.0–149.0)
VLDL: 44.6 mg/dL — ABNORMAL HIGH (ref 0.0–40.0)

## 2019-10-24 LAB — HEMOGLOBIN A1C: Hgb A1c MFr Bld: 5.8 % (ref 4.6–6.5)

## 2019-10-24 LAB — LDL CHOLESTEROL, DIRECT: Direct LDL: 184 mg/dL

## 2019-10-24 LAB — TSH: TSH: 1.34 u[IU]/mL (ref 0.35–4.50)

## 2019-10-27 NOTE — Progress Notes (Signed)
Subjective:    Patient ID: Monica Dillon, female    DOB: Mar 05, 1966, 53 y.o.   MRN: [id number]  No chief complaint on file.   HPI Patient is in today for annual preventative exam and follow up on chronic medical concerns including hyperlipidemia and hyperglycemia. No recent febrile illness or hospitalizations. She is here today for a pap and denies any GYN concerns. No discharge, pain or fevers. Is trying to maintain a heart healthy diet and stay active. Is masking and maintaining quarantine. Denies CP/palp/SOB/HA/congestion/fevers/GI or GU c/o. Taking meds as prescribed. Still struggles with insomnia at times. Has trouble falling and staying asleep  PMH: see history in EMR  Past Surgical History:  Procedure Laterality Date   TUBAL LIGATION  "about 20 yrs ago"    Family History  Problem Relation Age of Onset   Diabetes Maternal Grandmother    Drug abuse Father    Cancer Father    Cirrhosis Sister    Alcohol abuse Sister    Diverticulitis Mother    Cancer Daughter        skin   Aneurysm Maternal Aunt        brain    Social History   Socioeconomic History   Marital status: Divorced    Spouse name: Not on file   Number of children: Not on file   Years of education: 12   Highest education level: Not on file  Occupational History   Occupation: Field se[employer]ologist[employer]  Social Needs   Financial resource strain: Not on file   Food insecurity    Worry: Not on file    Inability: Not on file   Transportation needs    Medical: Not on file    Non-medical: Not on file  Tobacco Use   Smoking status: Current Every Day Smoker    Packs/day: 1.00    Years: 30.00    Pack years: 30.00    Types: Cigarettes   Smokeless tobacco: Never Used  Substance and Sexual Activity   Alcohol use: Yes    Alcohol/week: 0.0 standard drinks    Comment: occasional   Drug use: No   Sexual activity: Not on file    Comment: Lives with mother, boyfriend, 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, works at  call center, no dietary restrictions  Lifestyle   Physical activity    Days per week: Not on file    Minutes per session: Not on file   Stress: Not on file  Relationships   Social connections    Talks on phone: Not on file    Gets together: Not on file    Attends religious service: Not on file    Active member of club or organization: Not on file    Attends meetings of clubs or organizations: Not on file    Relationship status: Not on file   Intimate partner violence    Fear of current or ex partner: Not on file    Emotionally abused: Not on file    Physically abused: Not on file    Forced sexual activity: Not on file  Other Topics Concern   Not on file  Social History Narrative   Not on file    Outpatient Medications Prior to Visit  Medication Sig Dispense Refill   acetaminophen (TYLENOL) 325 MG tablet Take 650 mg by mouth every 6 (six) hours as needed for headache.     albuterol (VENTOLIN HFA) 108 (90 Base) MCG/ACT inhaler Inhale 2 puffs into the lungs every 6 (six) hours as  needed for wheezing or shortness of breath. 1 Inhaler 2   cetirizine (ZYRTEC) 10 MG tablet Take 1 tablet (10 mg total) by mouth 2 (two) times daily as needed for allergies. 60 tablet 11   azithromycin (ZITHROMAX) 250 MG tablet Take 2 tablets by mouth on day 1, followed by 1 tablet by mouth daily for 4 days. 6 tablet 0   benzonatate (TESSALON) 200 MG capsule Take 1 capsule (200 mg total) by mouth 3 (three) times daily as needed for cough. 30 capsule 0   HYDROcodone-homatropine (HYCODAN) 5-1.5 MG/5ML syrup Take 5 mLs by mouth every 6 (six) hours as needed. 100 mL 0   levofloxacin (LEVAQUIN) 500 MG tablet Take 1 tablet (500 mg total) by mouth daily. 7 tablet 0   predniSONE (STERAPRED UNI-PAK 21 TAB) 10 MG (21) TBPK tablet Taper over 6 days 21 tablet 0   No facility-administered medications prior to visit.     No Known Allergies  Review of Systems  Constitutional: Negative for chills, fever  and malaise/fatigue.  HENT: Negative for congestion and hearing loss.   Eyes: Negative for discharge.  Respiratory: Negative for cough, sputum production and shortness of breath.   Cardiovascular: Negative for chest pain, palpitations and leg swelling.  Gastrointestinal: Negative for abdominal pain, blood in stool, constipation, diarrhea, heartburn, nausea and vomiting.  Genitourinary: Negative for dysuria, frequency, hematuria and urgency.  Musculoskeletal: Negative for back pain, falls and myalgias.  Skin: Negative for rash.  Neurological: Negative for dizziness, sensory change, loss of consciousness, weakness and headaches.  Endo/Heme/Allergies: Negative for environmental allergies. Does not bruise/bleed easily.  Psychiatric/Behavioral: Negative for depression and suicidal ideas. The patient has insomnia. The patient is not nervous/anxious.        Objective:    Physical Exam Constitutional:      General: She is not in acute distress.    Appearance: She is well-developed.  HENT:     Head: Normocephalic and atraumatic.  Eyes:     Conjunctiva/sclera: Conjunctivae normal.  Neck:     Musculoskeletal: Neck supple.     Thyroid: No thyromegaly.  Cardiovascular:     Rate and Rhythm: Normal rate and regular rhythm.     Heart sounds: Normal heart sounds. No murmur.  Pulmonary:     Effort: Pulmonary effort is normal. No respiratory distress.     Breath sounds: Normal breath sounds.  Abdominal:     General: Bowel sounds are normal. There is no distension.     Palpations: Abdomen is soft. There is no mass.     Tenderness: There is no abdominal tenderness.  Genitourinary:    General: Normal vulva.     Vagina: No vaginal discharge.     Comments: No cervical lesions, no breast lesions, masses, skin changes or discharge Lymphadenopathy:     Cervical: No cervical adenopathy.  Skin:    General: Skin is warm and dry.  Neurological:     Mental Status: She is alert and oriented to person,  place, and time.  Psychiatric:        Behavior: Behavior normal.     BP 110/72 (BP Location: Left Arm, Patient Position: Sitting, Cuff Size: Normal)    Pulse 80    Temp 98.5 F (36.9 C) (Temporal)    Resp 18    Wt 128 lb 12.8 oz (58.4 kg)    LMP 01/08/2017    SpO2 98%    BMI 25.15 kg/m  Wt Readings from Last 3 Encounters:  10/23/19 128 lb 12.8  oz (58.4 kg)  10/21/18 133 lb (60.3 kg)  05/21/18 135 lb 6.4 oz (61.4 kg)    Diabetic Foot Exam - Simple   No data filed     Lab Results  Component Value Date   WBC 10.7 (H) 10/23/2019   HGB 15.2 (H) 10/23/2019   HCT 44.6 10/23/2019   PLT 344.0 10/23/2019   GLUCOSE 91 10/23/2019   CHOL 248 (H) 10/23/2019   TRIG 223.0 (H) 10/23/2019   HDL 32.40 (L) 10/23/2019   LDLDIRECT 184.0 10/23/2019   LDLCALC 156 (H) 11/15/2017   ALT 22 10/23/2019   AST 16 10/23/2019   NA 141 10/23/2019   K 4.7 10/23/2019   CL 101 10/23/2019   CREATININE 0.63 10/23/2019   BUN 9 10/23/2019   CO2 31 10/23/2019   TSH 1.34 10/23/2019   HGBA1C 5.8 10/23/2019    Lab Results  Component Value Date   TSH 1.34 10/23/2019   Lab Results  Component Value Date   WBC 10.7 (H) 10/23/2019   HGB 15.2 (H) 10/23/2019   HCT 44.6 10/23/2019   MCV 93.8 10/23/2019   PLT 344.0 10/23/2019   Lab Results  Component Value Date   NA 141 10/23/2019   K 4.7 10/23/2019   CO2 31 10/23/2019   GLUCOSE 91 10/23/2019   BUN 9 10/23/2019   CREATININE 0.63 10/23/2019   BILITOT 0.4 10/23/2019   ALKPHOS 65 10/23/2019   AST 16 10/23/2019   ALT 22 10/23/2019   PROT 7.4 10/23/2019   ALBUMIN 4.6 10/23/2019   CALCIUM 9.6 10/23/2019   GFR 98.79 10/23/2019   Lab Results  Component Value Date   CHOL 248 (H) 10/23/2019   Lab Results  Component Value Date   HDL 32.40 (L) 10/23/2019   Lab Results  Component Value Date   LDLCALC 156 (H) 11/15/2017   Lab Results  Component Value Date   TRIG 223.0 (H) 10/23/2019   Lab Results  Component Value Date   CHOLHDL 8 10/23/2019    Lab Results  Component Value Date   HGBA1C 5.8 10/23/2019       Assessment & Plan:   Problem List Items Addressed This Visit    Insomnia    Encouraged good sleep hygiene such as dark, quiet room. No blue/green glowing lights such as computer screens in bedroom. No alcohol or stimulants in evening. Cut down on caffeine as able. Regular exercise is helpful but not just prior to bed time. Given rx for Ambien to use prn      Preventative health care - Primary    Patient encouraged to maintain heart healthy diet, regular exercise, adequate sleep. Consider daily probiotics. Take medications as prescribed. Labs ordered and reviewed.       Relevant Orders   CBC (Completed)   CMP (Completed)   TSH (Completed)   Cervical cancer screening    Pap today, no concerns on exam.       Relevant Orders   Cytology - PAP( [place])   Hyperglycemia    hgba1c acceptable, minimize simple carbs. Increase exercise as tolerated.       Relevant Orders   A1C (Completed)   Hyperlipidemia    Encouraged heart healthy diet, increase exercise, avoid trans fats, consider a krill oil cap daily. Has worsened, consider medication if no irmpvoement after next set of labs      Relevant Orders   Lipid panel (Completed)   LDL cholesterol, direct (Completed)    Other Visit Diagnoses  Encounter for screening mammogram for malignant neoplasm of breast       Relevant Orders   MAMMOGRAM TOMO 3-D ([facility])      I have discontinued Binnie Monica Dillon benzonatate, levofloxacin, azithromycin, predniSONE, and HYDROcodone-homatropine. I am also having her start on zolpidem. Additionally, I am having her maintain her acetaminophen, cetirizine, and albuterol.  Meds ordered this encounter  Medications   zolpidem (AMBIEN) 5 MG tablet    Sig: Take 1 tablet (5 mg total) by mouth at bedtime as needed for sleep.    Dispense:  15 tablet    Refill:  3     Penni Homans, MD

## 2019-10-27 NOTE — Assessment & Plan Note (Signed)
Patient encouraged to maintain heart healthy diet, regular exercise, adequate sleep. Consider daily probiotics. Take medications as prescribed. Labs ordered and reviewed 

## 2019-10-27 NOTE — Assessment & Plan Note (Signed)
hgba1c acceptable, minimize simple carbs. Increase exercise as tolerated.  

## 2019-10-27 NOTE — Assessment & Plan Note (Signed)
Pap today, no concerns on exam.  

## 2019-10-27 NOTE — Assessment & Plan Note (Signed)
Encouraged good sleep hygiene such as dark, quiet room. No blue/green glowing lights such as computer screens in bedroom. No alcohol or stimulants in evening. Cut down on caffeine as able. Regular exercise is helpful but not just prior to bed time. Given rx for Ambien to use prn

## 2019-10-27 NOTE — Assessment & Plan Note (Signed)
Encouraged heart healthy diet, increase exercise, avoid trans fats, consider a krill oil cap daily. Has worsened, consider medication if no irmpvoement after next set of labs 

## 2019-10-29 LAB — CYTOLOGY - PAP
Comment: NEGATIVE
Diagnosis: NEGATIVE
High risk HPV: POSITIVE — AB

## 2020-02-25 DIAGNOSIS — Z20828 Contact with and (suspected) exposure to other viral communicable diseases: Secondary | ICD-10-CM | POA: Diagnosis not present

## 2020-04-23 ENCOUNTER — Ambulatory Visit (INDEPENDENT_AMBULATORY_CARE_PROVIDER_SITE_OTHER)
Admission: RE | Admit: 2020-04-23 | Discharge: 2020-04-23 | Disposition: A | Discharge status: Home / Self Care | Payer: BC Managed Care – PPO | Source: Ambulatory Visit

## 2020-04-23 DIAGNOSIS — R0981 Nasal congestion: Secondary | ICD-10-CM

## 2020-04-23 DIAGNOSIS — R059 Cough, unspecified: Secondary | ICD-10-CM

## 2020-04-23 DIAGNOSIS — J3489 Other specified disorders of nose and nasal sinuses: Secondary | ICD-10-CM

## 2020-04-23 DIAGNOSIS — J3089 Other allergic rhinitis: Secondary | ICD-10-CM | POA: Diagnosis not present

## 2020-04-23 DIAGNOSIS — R05 Cough: Secondary | ICD-10-CM

## 2020-04-23 MED ORDER — ALBUTEROL SULFATE HFA 108 (90 BASE) MCG/ACT IN AERS: 1.0000 | INHALATION_SPRAY | Freq: Four times a day (QID) | RESPIRATORY_TRACT | 0 refills | Status: DC | PRN

## 2020-04-23 MED ORDER — PROMETHAZINE-DM 6.25-15 MG/5ML PO SYRP: 5.0000 mL | ORAL_SOLUTION | Freq: Every evening | ORAL | 0 refills | Status: DC | PRN

## 2020-04-23 MED ORDER — BENZONATATE 100 MG PO CAPS: 100.0000 mg | ORAL_CAPSULE | Freq: Three times a day (TID) | ORAL | 0 refills | Status: DC | PRN

## 2020-04-23 MED ORDER — PREDNISONE 20 MG PO TABS: ORAL_TABLET | ORAL | 0 refills | Status: DC

## 2020-04-23 NOTE — ED Provider Notes (Signed)
Virtual Visit via Video Note:  [name]  initiated request for Telemedicine visit with [facility] Urgent Care team. I connected with [name]  on 04/23/2020 at 4:28 PM  for a synchronized telemedicine visit using a video enabled HIPPA compliant telemedicine application. I verified that I am speaking with [name]  using two identifiers. Wallis Bamberg, PA-C  was physically located in a [facility] Urgent care site and Leanny Moeckel was located at a different location.   The limitations of evaluation and management by telemedicine as well as the availability of in-person appointments were discussed. Patient was informed that she  may incur a bill ( including co-pay) for this virtual visit encounter. [name]  expressed understanding and gave verbal consent to proceed with virtual visit.     History of Present Illness:Monica Dillon  is a 54 y.o. female presents with 3 day hx persistent productive cough. Has similar sx every year around the same time each year. Has had sinus congestion for the past week. Uses Zyrtec and Flonase, is consistent with this in the spring.  Was a former smoker. Also has reactive airway in the spring, started using her albuterol inhaler yesterday.   Review of Systems  Constitutional: Positive for malaise/fatigue. Negative for fever.  HENT: Positive for congestion, sinus pain and sore throat (from post-nasal drainage). Negative for ear pain.   Eyes: Negative for discharge and redness.  Respiratory: Positive for cough. Negative for hemoptysis, shortness of breath and wheezing.   Cardiovascular: Negative for chest pain.  Gastrointestinal: Negative for abdominal pain, diarrhea, nausea and vomiting.  Genitourinary: Negative for dysuria, flank pain and hematuria.  Musculoskeletal: Negative for myalgias.  Skin: Negative for itching and rash.  Neurological: Negative for dizziness, weakness and headaches.  Psychiatric/Behavioral: Negative for depression and substance abuse.     No current facility-administered medications for this encounter.   Current Outpatient Medications  Medication Sig Dispense Refill  . acetaminophen (TYLENOL) 325 MG tablet Take 650 mg by mouth every 6 (six) hours as needed for headache.    . albuterol (VENTOLIN HFA) 108 (90 Base) MCG/ACT inhaler Inhale 2 puffs into the lungs every 6 (six) hours as needed for wheezing or shortness of breath. 1 Inhaler 2  . cetirizine (ZYRTEC) 10 MG tablet Take 1 tablet (10 mg total) by mouth 2 (two) times daily as needed for allergies. 60 tablet 11  . zolpidem (AMBIEN) 5 MG tablet Take 1 tablet (5 mg total) by mouth at bedtime as needed for sleep. 15 tablet 3     No Known Allergies     Past Surgical History:  Procedure Laterality Date  . TUBAL LIGATION  "about 20 yrs ago"      Observations/Objective: Physical Exam Constitutional:      General: She is not in acute distress.    Appearance: Normal appearance. She is well-developed. She is not ill-appearing, toxic-appearing or diaphoretic.  Eyes:     Extraocular Movements: Extraocular movements intact.  Pulmonary:     Effort: Pulmonary effort is normal.     Comments: Coughing persistently through the visit. Neurological:     General: No focal deficit present.     Mental Status: She is alert and oriented to person, place, and time.  Psychiatric:        Mood and Affect: Mood normal.        Behavior: Behavior normal.        Thought Content: Thought content normal.        Judgment: Judgment  normal.     Assessment and Plan:  PDMP not reviewed this encounter.  1. Allergic rhinitis due to other allergic trigger, unspecified seasonality   2. Sinus congestion   3. Cough   4. Sinus pain     Start prednisone today for allergic rhinitis. Cough suppression medications offered.  Recommended maintaining Zyrtec but hold off on Flonase.  If there is no improvement in 3 to 4 days, patient should get an antibiotic course to cover for secondary  sinusitis.  Refilled her albuterol inhaler.  Patient is not interested in COVID-19 testing right now she is vaccinated, everyone in her home is as well. Counseled patient on potential for adverse effects with medications prescribed/recommended today, ER and return-to-clinic precautions discussed, patient verbalized understanding.    Follow Up Instructions:    I discussed the assessment and treatment plan with the patient. The patient was provided an opportunity to ask questions and all were answered. The patient agreed with the plan and demonstrated an understanding of the instructions.   The patient was advised to call back or seek an in-person evaluation if the symptoms worsen or if the condition fails to improve as anticipated.  I provided 15 minutes of non-face-to-face time during this encounter.    Jaynee Eagles, PA-C  04/23/2020 4:28 PM         Jaynee Eagles, PA-C 04/23/20 [phone]

## 2020-05-28 ENCOUNTER — Ambulatory Visit (INDEPENDENT_AMBULATORY_CARE_PROVIDER_SITE_OTHER)
Admission: RE | Admit: 2020-05-28 | Discharge: 2020-05-28 | Disposition: A | Discharge status: Home / Self Care | Payer: BC Managed Care – PPO | Source: Ambulatory Visit

## 2020-05-28 ENCOUNTER — Other Ambulatory Visit: Payer: Self-pay

## 2020-05-28 DIAGNOSIS — J0100 Acute maxillary sinusitis, unspecified: Secondary | ICD-10-CM | POA: Diagnosis not present

## 2020-05-28 DIAGNOSIS — R059 Cough, unspecified: Secondary | ICD-10-CM

## 2020-05-28 MED ORDER — AMOXICILLIN 875 MG PO TABS: 875.0000 mg | ORAL_TABLET | Freq: Two times a day (BID) | ORAL | 0 refills | Status: AC

## 2020-05-28 MED ORDER — BENZONATATE 100 MG PO CAPS
100.0000 mg | ORAL_CAPSULE | Freq: Three times a day (TID) | ORAL | 0 refills | Status: DC | PRN
Start: 2020-05-28 — End: 2022-03-13

## 2020-05-28 MED ORDER — PREDNISONE 10 MG (21) PO TBPK: ORAL_TABLET | Freq: Every day | ORAL | 0 refills | Status: DC

## 2020-05-28 NOTE — ED Provider Notes (Signed)
Virtual Visit via Video Note:  Monica Dillon  initiated request for Telemedicine visit with [facility] team. I connected with Monica Dillon  on 05/28/2020 at 10:17 AM  for a synchronized telemedicine visit using a video enabled HIPPA compliant telemedicine application. I verified that I am speaking with Monica Dillon  using two identifiers. Monica Bail, NP  was physically located in a [facility] Urgent care site and Monica Dillon was located at a different location.   The limitations of evaluation and management by telemedicine as well as the availability of in-person appointments were discussed. Patient was informed that she  may incur a bill ( including co-pay) for this virtual visit encounter. Monica Dillon  expressed understanding and gave verbal consent to proceed with virtual visit.     History of Present Illness:Monica Dillon  is a 54 y.o. female presents for evaluation of nasal congestion, sinus pressure, postnasal drip, cough which is mostly nonproductive but occasionally productive of yellow-green phlegm, sore throat x 5 days.   Patient had similar symptoms last month and was seen on video visit on 04/23/2020; treated with prednisone, Tessalon Perles, and promethazine-dextromethorphan.  She denies fever, chills, shortness of breath, vomiting, diarrhea, or other symptoms.     No Known Allergies      Social History   Tobacco Use  . Smoking status: Current Every Day Smoker    Packs/day: 1.00    Years: 30.00    Pack years: 30.00    Types: Cigarettes  . Smokeless tobacco: Never Used  Vaping Use  . Vaping Use: Never used  Substance Use Topics  . Alcohol use: Yes    Alcohol/week: 0.0 standard drinks    Comment: occasional  . Drug use: No   ROS: as stated in HPI.  All other systems reviewed and negative.      Observations/Objective: Physical Exam  VITALS: Patient denies fever. GENERAL: Alert, appears well and in no acute distress. HEENT: Atraumatic. Oral mucosa appear moist.   NECK: Normal movements of the head and neck. CARDIOPULMONARY: Dry cough throughout exam.  No increased WOB. Speaking in clear sentences. I:E ratio WNL.  MS: Moves all visible extremities without noticeable abnormality. PSYCH: Pleasant and cooperative, well-groomed. Speech normal rate and rhythm. Affect is appropriate. Insight and judgement are appropriate. Attention is focused, linear, and appropriate.  NEURO: CN grossly intact. Oriented as arrived to appointment on time with no prompting. Moves both UE equally.  SKIN: No obvious lesions, wounds, erythema, or cyanosis noted on face or hands.   Assessment and Plan:    ICD-10-CM   1. Acute non-recurrent maxillary sinusitis  J01.00   2. Cough  R05        Follow Up Instructions: Treating with amoxicillin, prednisone, and Tessalon Perles.  Instructed patient to continue using her albuterol inhaler as directed.  Instructed her to follow-up with her PCP or come in to be seen in person if her symptoms or not improving.  Patient agrees to plan of care.      I discussed the assessment and treatment plan with the patient. The patient was provided an opportunity to ask questions and all were answered. The patient agreed with the plan and demonstrated an understanding of the instructions.   The patient was advised to call back or seek an in-person evaluation if the symptoms worsen or if the condition fails to improve as anticipated.      Monica Bail, NP  05/28/2020 10:17 AM  Monica Balloon, NP 05/28/20 [id number]

## 2020-05-28 NOTE — Discharge Instructions (Signed)
Take the amoxicillin, prednisone, and Tessalon Perles as directed.  Continue to use your albuterol inhaler.    Follow up with your primary care provider or come here to be seen in person if your symptoms are not improving.

## 2020-07-28 DIAGNOSIS — Z03818 Encounter for observation for suspected exposure to other biological agents ruled out: Secondary | ICD-10-CM | POA: Diagnosis not present

## 2020-10-26 ENCOUNTER — Other Ambulatory Visit: Payer: Self-pay

## 2020-10-26 ENCOUNTER — Ambulatory Visit (INDEPENDENT_AMBULATORY_CARE_PROVIDER_SITE_OTHER): Payer: BC Managed Care – PPO | Admitting: Medical

## 2020-10-26 VITALS — BP 106/69 | HR 89 | Resp 18 | Ht 61.0 in | Wt 125.6 lb

## 2020-10-26 DIAGNOSIS — Z1231 Encounter for screening mammogram for malignant neoplasm of breast: Secondary | ICD-10-CM | POA: Diagnosis not present

## 2020-10-26 DIAGNOSIS — Z0000 Encounter for general adult medical examination without abnormal findings: Secondary | ICD-10-CM | POA: Diagnosis not present

## 2020-10-26 DIAGNOSIS — R739 Hyperglycemia, unspecified: Secondary | ICD-10-CM | POA: Diagnosis not present

## 2020-10-26 DIAGNOSIS — Z23 Encounter for immunization: Secondary | ICD-10-CM | POA: Diagnosis not present

## 2020-10-26 NOTE — Patient Instructions (Addendum)
For you wellness exam today I have ordered cbc, cmp and lipid panel.  For elevated sugar get lipid panel.  Tdap today.  Recommend exercise and healthy diet.  We will let you know lab results as they come in.  Follow up date appointment will be determined after lab review.    Preventive Care 3-54 Years Old, Female Preventive care refers to visits with your health care provider and lifestyle choices that can promote health and wellness. This includes:  A yearly physical exam. This may also be called an annual well check.  Regular dental visits and eye exams.  Immunizations.  Screening for certain conditions.  Healthy lifestyle choices, such as eating a healthy diet, getting regular exercise, not using drugs or products that contain nicotine and tobacco, and limiting alcohol use. What can I expect for my preventive care visit? Physical exam Your health care provider will check your:  Height and weight. This may be used to calculate body mass index (BMI), which tells if you are at a healthy weight.  Heart rate and blood pressure.  Skin for abnormal spots. Counseling Your health care provider may ask you questions about your:  Alcohol, tobacco, and drug use.  Emotional well-being.  Home and relationship well-being.  Sexual activity.  Eating habits.  Work and work Statistician.  Method of birth control.  Menstrual cycle.  Pregnancy history. What immunizations do I need?  Influenza (flu) vaccine  This is recommended every year. Tetanus, diphtheria, and pertussis (Tdap) vaccine  You may need a Td booster every 10 years. Varicella (chickenpox) vaccine  You may need this if you have not been vaccinated. Zoster (shingles) vaccine  You may need this after age 26. Measles, mumps, and rubella (MMR) vaccine  You may need at least one dose of MMR if you were born in 1957 or later. You may also need a second dose. Pneumococcal conjugate (PCV13) vaccine  You may  need this if you have certain conditions and were not previously vaccinated. Pneumococcal polysaccharide (PPSV23) vaccine  You may need one or two doses if you smoke cigarettes or if you have certain conditions. Meningococcal conjugate (MenACWY) vaccine  You may need this if you have certain conditions. Hepatitis A vaccine  You may need this if you have certain conditions or if you travel or work in places where you may be exposed to hepatitis A. Hepatitis B vaccine  You may need this if you have certain conditions or if you travel or work in places where you may be exposed to hepatitis B. Haemophilus influenzae type b (Hib) vaccine  You may need this if you have certain conditions. Human papillomavirus (HPV) vaccine  If recommended by your health care provider, you may need three doses over 6 months. You may receive vaccines as individual doses or as more than one vaccine together in one shot (combination vaccines). Talk with your health care provider about the risks and benefits of combination vaccines. What tests do I need? Blood tests  Lipid and cholesterol levels. These may be checked every 5 years, or more frequently if you are over 26 years old.  Hepatitis C test.  Hepatitis B test. Screening  Lung cancer screening. You may have this screening every year starting at age 37 if you have a 30-pack-year history of smoking and currently smoke or have quit within the past 15 years.  Colorectal cancer screening. All adults should have this screening starting at age 36 and continuing until age 25. Your health care  provider may recommend screening at age 60 if you are at increased risk. You will have tests every 1-10 years, depending on your results and the type of screening test.  Diabetes screening. This is done by checking your blood sugar (glucose) after you have not eaten for a while (fasting). You may have this done every 1-3 years.  Mammogram. This may be done every 1-2  years. Talk with your health care provider about when you should start having regular mammograms. This may depend on whether you have a family history of breast cancer.  BRCA-related cancer screening. This may be done if you have a family history of breast, ovarian, tubal, or peritoneal cancers.  Pelvic exam and Pap test. This may be done every 3 years starting at age 74. Starting at age 78, this may be done every 5 years if you have a Pap test in combination with an HPV test. Other tests  Sexually transmitted disease (STD) testing.  Bone density scan. This is done to screen for osteoporosis. You may have this scan if you are at high risk for osteoporosis. Follow these instructions at home: Eating and drinking  Eat a diet that includes fresh fruits and vegetables, whole grains, lean protein, and low-fat dairy.  Take vitamin and mineral supplements as recommended by your health care provider.  Do not drink alcohol if: ? Your health care provider tells you not to drink. ? You are pregnant, may be pregnant, or are planning to become pregnant.  If you drink alcohol: ? Limit how much you have to 0-1 drink a day. ? Be aware of how much alcohol is in your drink. In the [place] one drink equals one 12 oz bottle of beer (355 mL), one 5 oz glass of wine (148 mL), or one 1 oz glass of hard liquor (44 mL). Lifestyle  Take daily care of your teeth and gums.  Stay active. Exercise for at least 30 minutes on 5 or more days each week.  Do not use any products that contain nicotine or tobacco, such as cigarettes, e-cigarettes, and chewing tobacco. If you need help quitting, ask your health care provider.  If you are sexually active, practice safe sex. Use a condom or other form of birth control (contraception) in order to prevent pregnancy and STIs (sexually transmitted infections).  If told by your health care provider, take low-dose aspirin daily starting at age 72. What's next?  Visit your  health care provider once a year for a well check visit.  Ask your health care provider how often you should have your eyes and teeth checked.  Stay up to date on all vaccines. This information is not intended to replace advice given to you by your health care provider. Make sure you discuss any questions you have with your health care provider. Document Revised: 08/15/2018 Document Reviewed: 08/15/2018 Elsevier Patient Education  2020 [employer].

## 2020-10-26 NOTE — Progress Notes (Signed)
Subjective:    Patient ID: Monica Dillon, female    DOB: [birth year], 54 y.o.   MRN: [id number]  HPI  Pt in for wellness exam.  Pt work [employer]. Working from home most of the time. Pt walks dogs every night. Pt admits diet not real healthy. Caffeine 2 cups of coffee a day. No alcohol use. Pt quit smoking more than a a year.  Pt had covid vaccine already. Flu vaccine in October 2021.  Pt did cologuard last year. Was negative.  Pt needs screening mammogram.  Pt has last pap 10-2019.  Elevated sugar in past. But not diabetic by a1c.  Pt will get tdap today.      Review of Systems  Constitutional: Negative for chills, fatigue and fever.  Respiratory: Negative for cough, chest tightness, shortness of breath and wheezing.   Cardiovascular: Negative for chest pain and palpitations.  Gastrointestinal: Negative for abdominal pain.  Genitourinary: Negative for dysuria.  Musculoskeletal: Negative for back pain and neck pain.  Skin: Negative for rash.  Neurological: Negative for dizziness, seizures, weakness and headaches.  Hematological: Negative for adenopathy.  Psychiatric/Behavioral: Negative for behavioral problems, confusion, dysphoric mood, sleep disturbance and suicidal ideas. The patient is not nervous/anxious.        Social History   Socioeconomic History   Marital status: Divorced    Spouse name: Not on file   Number of children: Not on file   Years of education: 12   Highest education level: Not on file  Occupational History   Occupation: [employer]  Tobacco Use   Smoking status: Current Every Day Smoker    Packs/day: 1.00    Years: 30.00    Pack years: 30.00    Types: Cigarettes   Smokeless tobacco: Never Used  Vaping Use   Vaping Use: Never used  Substance and Sexual Activity   Alcohol use: Yes    Alcohol/week: 0.0 standard drinks    Comment: occasional   Drug use: No   Sexual activity: Not on file    Comment: Lives with  mother, boyfriend, 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, works at call center, no dietary restrictions  Other Topics Concern   Not on file  Social History Narrative   Not on file   Social Determinants of Health   Financial Resource Strain:    Difficulty of Paying Living Expenses: Not on file  Food Insecurity:    Worried About Programme researcher, broadcasting/film/video in the Last Year: Not on file   [employer] of Food in the Last Year: Not on file  Transportation Needs:    Lack of Transportation (Medical): Not on file   Lack of Transportation (Non-Medical): Not on file  Physical Activity:    Days of Exercise per Week: Not on file   Minutes of Exercise per Session: Not on file  Stress:    Feeling of Stress : Not on file  Social Connections:    Frequency of Communication with Friends and Family: Not on file   Frequency of Social Gatherings with Friends and Family: Not on file   Attends Religious Services: Not on file   Active Member of Clubs or Organizations: Not on file   Attends Banker Meetings: Not on file   Marital Status: Not on file  Intimate Partner Violence:    Fear of Current or Ex-Partner: Not on file   Emotionally Abused: Not on file   Physically Abused: Not on file   Sexually Abused: Not on file  Past Surgical History:  Procedure Laterality Date   TUBAL LIGATION  "about 20 yrs ago"    Family History  Problem Relation Age of Onset   Diabetes Maternal Grandmother    Drug abuse Father    Cancer Father    Cirrhosis Sister    Alcohol abuse Sister    Diverticulitis Mother    Cancer Daughter        skin   Aneurysm Maternal Aunt        brain    No Known Allergies  Current Outpatient Medications on File Prior to Visit  Medication Sig Dispense Refill   zolpidem (AMBIEN) 5 MG tablet Take 1 tablet (5 mg total) by mouth at bedtime as needed for sleep. 15 tablet 3   acetaminophen (TYLENOL) 325 MG tablet Take 650 mg by mouth every 6 (six) hours as needed for  headache. (Patient not taking: Reported on 10/26/2020)     albuterol (VENTOLIN HFA) 108 (90 Base) MCG/ACT inhaler Inhale 1-2 puffs into the lungs every 6 (six) hours as needed for wheezing or shortness of breath. (Patient not taking: Reported on 10/26/2020) 18 g 0   benzonatate (TESSALON) 100 MG capsule Take 1 capsule (100 mg total) by mouth 3 (three) times daily as needed for cough. 21 capsule 0   cetirizine (ZYRTEC) 10 MG tablet Take 1 tablet (10 mg total) by mouth 2 (two) times daily as needed for allergies. (Patient not taking: Reported on 10/26/2020) 60 tablet 11   predniSONE (STERAPRED UNI-PAK 21 TAB) 10 MG (21) TBPK tablet Take by mouth daily. Take 6 tabs by mouth daily  for 1 day, then 5 tabs for 1 day, then 4 tabs for 1 day, then 3 tabs for 1 day, 2 tabs for 1 day, then 1 tab by mouth daily for 1 day 21 tablet 0   promethazine-dextromethorphan (PROMETHAZINE-DM) 6.25-15 MG/5ML syrup Take 5 mLs by mouth at bedtime as needed for cough. 100 mL 0   No current facility-administered medications on file prior to visit.    BP 106/69    Pulse 89    Resp 18    Ht 5\' 1"  (1.549 m)    Wt 125 lb 9.6 oz (57 kg)    LMP 01/08/2017    SpO2 97%    BMI 23.73 kg/m       Objective:   Physical Exam  General Mental Status- Alert. General Appearance- Not in acute distress.    Neck Carotid Arteries- Normal color. Moisture- Normal Moisture. No carotid bruits. No JVD.  Chest and Lung Exam Auscultation: Breath Sounds:-Normal.  Cardiovascular Auscultation:Rythm- Regular. Murmurs & Other Heart Sounds:Auscultation of the heart reveals- No Murmurs.  Abdomen Inspection:-Inspeection Normal. Palpation/Percussion:Note:No mass. Palpation and Percussion of the abdomen reveal- Non Tender, Non Distended + BS, no rebound or guarding.   Neurologic Cranial Nerve exam:- CN III-XII intact(No nystagmus), symmetric smile. Strength:- 5/5 equal and symmetric strength both upper and lower  extremities.  Derm-scatterd small/tiny moles. Some freckles. No worrisome moles or lesions presently.  Back- no cva tenderness.    Assessment & Plan:  For you wellness exam today I have ordered cbc, cmp and lipid panel.  For elevated sugar get lipid panel.  Tdap today.  Recommend exercise and healthy diet.  We will let you know lab results as they come in.  Follow up date appointment will be determined after lab review.   01/10/2017, PA-C

## 2020-10-26 NOTE — Addendum Note (Signed)
Addended by: Maximino Sarin on: 10/26/2020 03:24 PM   Modules accepted: Orders

## 2020-10-27 ENCOUNTER — Telehealth: Payer: Self-pay | Admitting: Medical

## 2020-10-27 LAB — CBC WITH DIFFERENTIAL/PLATELET
Basophils Absolute: 0.1 10*3/uL (ref 0.0–0.1)
Basophils Relative: 0.6 % (ref 0.0–3.0)
Eosinophils Absolute: 0.2 10*3/uL (ref 0.0–0.7)
Eosinophils Relative: 2.2 % (ref 0.0–5.0)
HCT: 45.8 % (ref 36.0–46.0)
Hemoglobin: 15.7 g/dL — ABNORMAL HIGH (ref 12.0–15.0)
Lymphocytes Relative: 34.6 % (ref 12.0–46.0)
Lymphs Abs: 3.3 10*3/uL (ref 0.7–4.0)
MCHC: 34.2 g/dL (ref 30.0–36.0)
MCV: 92.6 fl (ref 78.0–100.0)
Monocytes Absolute: 0.8 10*3/uL (ref 0.1–1.0)
Monocytes Relative: 8.4 % (ref 3.0–12.0)
Neutro Abs: 5.3 10*3/uL (ref 1.4–7.7)
Neutrophils Relative %: 54.2 % (ref 43.0–77.0)
Platelets: 322 10*3/uL (ref 150.0–400.0)
RBC: 4.94 Mil/uL (ref 3.87–5.11)
RDW: 12.5 % (ref 11.5–15.5)
WBC: 9.7 10*3/uL (ref 4.0–10.5)

## 2020-10-27 LAB — COMPREHENSIVE METABOLIC PANEL
ALT: 15 U/L (ref 0–35)
AST: 15 U/L (ref 0–37)
Albumin: 4.4 g/dL (ref 3.5–5.2)
Alkaline Phosphatase: 58 U/L (ref 39–117)
BUN: 12 mg/dL (ref 6–23)
CO2: 32 mEq/L (ref 19–32)
Calcium: 9.9 mg/dL (ref 8.4–10.5)
Chloride: 103 mEq/L (ref 96–112)
Creatinine, Ser: 0.74 mg/dL (ref 0.40–1.20)
GFR: 91.89 mL/min (ref >60.00)
Glucose, Bld: 86 mg/dL (ref 70–99)
Potassium: 5.1 mEq/L (ref 3.5–5.1)
Sodium: 142 mEq/L (ref 135–145)
Total Bilirubin: 0.4 mg/dL (ref 0.2–1.2)
Total Protein: 7 g/dL (ref 6.0–8.3)

## 2020-10-27 LAB — LIPID PANEL
Cholesterol: 233 mg/dL — ABNORMAL HIGH (ref 0–200)
HDL: 35.4 mg/dL — ABNORMAL LOW (ref >39.00)
LDL Cholesterol: 161 mg/dL — ABNORMAL HIGH (ref 0–99)
NonHDL: 197.71
Total CHOL/HDL Ratio: 7
Triglycerides: 185 mg/dL — ABNORMAL HIGH (ref 0.0–149.0)
VLDL: 37 mg/dL (ref 0.0–40.0)

## 2020-10-27 LAB — HEMOGLOBIN A1C: Hgb A1c MFr Bld: 6.1 % (ref 4.6–6.5)

## 2020-10-27 NOTE — Telephone Encounter (Signed)
Opened to review 

## 2021-05-18 ENCOUNTER — Other Ambulatory Visit: Payer: Self-pay

## 2021-05-18 ENCOUNTER — Encounter: Payer: Self-pay | Admitting: Medical

## 2021-05-18 ENCOUNTER — Telehealth (INDEPENDENT_AMBULATORY_CARE_PROVIDER_SITE_OTHER): Payer: BC Managed Care – PPO | Admitting: Medical

## 2021-05-18 DIAGNOSIS — J40 Bronchitis, not specified as acute or chronic: Secondary | ICD-10-CM

## 2021-05-18 DIAGNOSIS — R059 Cough, unspecified: Secondary | ICD-10-CM | POA: Diagnosis not present

## 2021-05-18 MED ORDER — AZITHROMYCIN 250 MG PO TABS: ORAL_TABLET | ORAL | 0 refills | Status: AC

## 2021-05-18 MED ORDER — BENZONATATE 100 MG PO CAPS: 100.0000 mg | ORAL_CAPSULE | Freq: Three times a day (TID) | ORAL | 0 refills | Status: DC | PRN

## 2021-05-18 MED ORDER — METHYLPREDNISOLONE 4 MG PO TABS
ORAL_TABLET | ORAL | 0 refills | Status: DC
Start: 2021-05-18 — End: 2022-03-13

## 2021-05-18 NOTE — Progress Notes (Signed)
   Subjective:    Patient ID: Monica Dillon, female    DOB: [birth year], 55 y.o.   MRN: [id number]  HPI  Virtual Visit via Video Note  I connected with [name] on 05/18/21 at  4:00 PM EDT by a video enabled telemedicine application and verified that I am speaking with the correct person using two identifiers.  Location: Patient: home Provider: office     I discussed the limitations of evaluation and management by telemedicine and the availability of in person appointments. The patient expressed understanding and agreed to proceed.  History of Present Illness:  Cough for one week. Started with sinus pressure. Pt states started like allergies. No sneezing or itching eyes. Points to ethmoid sinus and some ear pain. Pt has been trying benzonatate. Some productive cough intermittently.  No wheezing or sob. Mild chest congestion.  Pt did covid test this moming and was negative.   Pt had 3 covid vaccines.  Pt mentons in past with above like symptoms would eventually need taper steroid.       Observations/Objective: General-no acute distress, pleasant, oriented. Lungs- on inspection lungs appear unlabored. Neck- no tracheal deviation or jvd on inspection. Neuro- gross motor function appears intact.  Assessment and Plan: You appear to have bronchitis. Rest hydrate and tylenol for fever. I am prescribing cough medicine benzonate and antibiotic. For your nasal congestion you could continue flonase and zyrtec.  You expressed that you may need taper steroid. Hard to say based on video visit exam. Making available if needed. Hold presently and start if needed over next 2 days.  You should gradually get better. If not then notify us and would recommend a chest xray.  Follow up in 7-10 days or as needed  Esperanza Richters, PA-C    Time spent with patient today was 31  minutes which consisted of chart review, discussing diagnosis, work up treatment and documentation.  Follow Up  Instructions:    I discussed the assessment and treatment plan with the patient. The patient was provided an opportunity to ask questions and all were answered. The patient agreed with the plan and demonstrated an understanding of the instructions.   The patient was advised to call back or seek an in-person evaluation if the symptoms worsen or if the condition fails to improve as anticipated.     Esperanza Richters, PA-C   Review of Systems  Constitutional: Negative for chills, fatigue and fever.  HENT: Positive for congestion and sinus pressure.   Respiratory: Positive for cough. Negative for shortness of breath and wheezing.   Cardiovascular: Negative for chest pain and palpitations.  Gastrointestinal: Negative for abdominal pain.  Musculoskeletal: Negative for back pain.  Hematological: Negative for adenopathy. Does not bruise/bleed easily.  Psychiatric/Behavioral: Negative for decreased concentration and dysphoric mood.       Objective:   Physical Exam        Assessment & Plan:

## 2021-05-18 NOTE — Patient Instructions (Addendum)
You appear to have bronchitis. Rest hydrate and tylenol for fever. I am prescribing cough medicine benzonate and antibiotic. For your nasal congestion you could continue flonase and zyrtec.  You expressed that you may need taper steroid. Hard to say based on video visit exam. Making available if needed. Hold presently and start if needed over next 2 days.  You should gradually get better. If not then notify us and would recommend a chest xray.  Follow up in 7-10 days or as needed  Recommend getting over the counter covid test. If + notify me and discuss potential use of new antiviral medicie called paxlovid.

## 2021-11-01 ENCOUNTER — Encounter: Payer: BC Managed Care – PPO | Admitting: Medical
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
## 2021-11-01 NOTE — Patient Instructions (Addendum)
For you wellness exam today I have ordered cbc, cmp, hiv   and lipid panel.  Vaccinnes up to date.  Recommend exercise and healthy diet.  We will let you know lab results as they come in.  Follow up date appointment will be determined after lab review.    Screening mammogram ordered.  For elevated sugar a1c.  Preventive Care 55-55 Years Old, Female Preventive care refers to lifestyle choices and visits with your health care provider that can promote health and wellness. Preventive care visits are also called wellness exams. What can I expect for my preventive care visit? Counseling Your health care provider may ask you questions about your: Medical history, including: Past medical problems. Family medical history. Pregnancy history. Current health, including: Menstrual cycle. Method of birth control. Emotional well-being. Home life and relationship well-being. Sexual activity and sexual health. Lifestyle, including: Alcohol, nicotine or tobacco, and drug use. Access to firearms. Diet, exercise, and sleep habits. Work and work Statistician. Sunscreen use. Safety issues such as seatbelt and bike helmet use. Physical exam Your health care provider will check your: Height and weight. These may be used to calculate your BMI (body mass index). BMI is a measurement that tells if you are at a healthy weight. Waist circumference. This measures the distance around your waistline. This measurement also tells if you are at a healthy weight and may help predict your risk of certain diseases, such as type 2 diabetes and high blood pressure. Heart rate and blood pressure. Body temperature. Skin for abnormal spots. What immunizations do I need? Vaccines are usually given at various ages, according to a schedule. Your health care provider will recommend vaccines for you based on your age, medical history, and lifestyle or other factors, such as travel or where you work. What tests do I  need? Screening Your health care provider may recommend screening tests for certain conditions. This may include: Lipid and cholesterol levels. Diabetes screening. This is done by checking your blood sugar (glucose) after you have not eaten for a while (fasting). Pelvic exam and Pap test. Hepatitis B test. Hepatitis C test. HIV (human immunodeficiency virus) test. STI (sexually transmitted infection) testing, if you are at risk. Lung cancer screening. Colorectal cancer screening. Mammogram. Talk with your health care provider about when you should start having regular mammograms. This may depend on whether you have a family history of breast cancer. BRCA-related cancer screening. This may be done if you have a family history of breast, ovarian, tubal, or peritoneal cancers. Bone density scan. This is done to screen for osteoporosis. Talk with your health care provider about your test results, treatment options, and if necessary, the need for more tests. Follow these instructions at home: Eating and drinking  Eat a diet that includes fresh fruits and vegetables, whole grains, lean protein, and low-fat dairy products. Take vitamin and mineral supplements as recommended by your health care provider. Do not drink alcohol if: Your health care provider tells you not to drink. You are pregnant, may be pregnant, or are planning to become pregnant. If you drink alcohol: Limit how much you have to 0-1 drink a day. Know how much alcohol is in your drink. In the [place] one drink equals one 12 oz bottle of beer (355 mL), one 5 oz glass of wine (148 mL), or one 1 oz glass of hard liquor (44 mL). Lifestyle Brush your teeth every morning and night with fluoride toothpaste. Floss one time each day. Exercise for at least  30 minutes 5 or more days each week. Do not use any products that contain nicotine or tobacco. These products include cigarettes, chewing tobacco, and vaping devices, such as  e-cigarettes. If you need help quitting, ask your health care provider. Do not use drugs. If you are sexually active, practice safe sex. Use a condom or other form of protection to prevent STIs. If you do not wish to become pregnant, use a form of birth control. If you plan to become pregnant, see your health care provider for a prepregnancy visit. Take aspirin only as told by your health care provider. Make sure that you understand how much to take and what form to take. Work with your health care provider to find out whether it is safe and beneficial for you to take aspirin daily. Find healthy ways to manage stress, such as: Meditation, yoga, or listening to music. Journaling. Talking to a trusted person. Spending time with friends and family. Minimize exposure to UV radiation to reduce your risk of skin cancer. Safety Always wear your seat belt while driving or riding in a vehicle. Do not drive: If you have been drinking alcohol. Do not ride with someone who has been drinking. When you are tired or distracted. While texting. If you have been using any mind-altering substances or drugs. Wear a helmet and other protective equipment during sports activities. If you have firearms in your house, make sure you follow all gun safety procedures. Seek help if you have been physically or sexually abused. What's next? Visit your health care provider once a year for an annual wellness visit. Ask your health care provider how often you should have your eyes and teeth checked. Stay up to date on all vaccines. This information is not intended to replace advice given to you by your health care provider. Make sure you discuss any questions you have with your health care provider. Document Revised: 06/01/2021 Document Reviewed: 06/01/2021 Elsevier Patient Education  [place].

## 2022-03-13 ENCOUNTER — Encounter: Payer: Self-pay | Admitting: Family Medicine

## 2022-03-13 ENCOUNTER — Ambulatory Visit (INDEPENDENT_AMBULATORY_CARE_PROVIDER_SITE_OTHER): Payer: BC Managed Care – PPO | Admitting: Family Medicine

## 2022-03-13 VITALS — BP 121/77 | HR 84 | Temp 97.7°F | Ht 61.0 in | Wt 132.6 lb

## 2022-03-13 DIAGNOSIS — J40 Bronchitis, not specified as acute or chronic: Secondary | ICD-10-CM

## 2022-03-13 DIAGNOSIS — J329 Chronic sinusitis, unspecified: Secondary | ICD-10-CM | POA: Diagnosis not present

## 2022-03-13 MED ORDER — ALBUTEROL SULFATE HFA 108 (90 BASE) MCG/ACT IN AERS: 1.0000 | INHALATION_SPRAY | Freq: Four times a day (QID) | RESPIRATORY_TRACT | 2 refills | Status: DC | PRN

## 2022-03-13 MED ORDER — PREDNISONE 20 MG PO TABS: 40.0000 mg | ORAL_TABLET | Freq: Every day | ORAL | 0 refills | Status: AC

## 2022-03-13 MED ORDER — PROMETHAZINE-DM 6.25-15 MG/5ML PO SYRP: 2.5000 mL | ORAL_SOLUTION | Freq: Four times a day (QID) | ORAL | 0 refills | Status: AC | PRN

## 2022-03-13 MED ORDER — AZITHROMYCIN 250 MG PO TABS: ORAL_TABLET | ORAL | 0 refills | Status: DC
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
## 2022-03-17 ENCOUNTER — Telehealth: Payer: Self-pay | Admitting: Family Medicine

## 2022-03-17 DIAGNOSIS — J329 Chronic sinusitis, unspecified: Secondary | ICD-10-CM

## 2022-03-17 MED ORDER — AMOXICILLIN-POT CLAVULANATE 875-125 MG PO TABS: 1.0000 | ORAL_TABLET | Freq: Two times a day (BID) | ORAL | 0 refills | Status: AC

## 2022-03-17 NOTE — Telephone Encounter (Signed)
Pt.notified

## 2022-03-17 NOTE — Telephone Encounter (Signed)
Patient states she has not felt better since coming in for her OV on 3/27 with Ladona Ridgel. She would like to know if Ladona Ridgel can sen her something else. Please advise.  ?

## 2022-04-28 ENCOUNTER — Encounter: Payer: Self-pay | Admitting: Family Medicine

## 2022-04-28 ENCOUNTER — Ambulatory Visit (INDEPENDENT_AMBULATORY_CARE_PROVIDER_SITE_OTHER): Payer: BC Managed Care – PPO | Admitting: Family Medicine

## 2022-04-28 VITALS — BP 134/76 | HR 83 | Temp 97.7°F | Ht 61.0 in | Wt 132.8 lb

## 2022-04-28 DIAGNOSIS — K047 Periapical abscess without sinus: Secondary | ICD-10-CM | POA: Diagnosis not present

## 2022-04-28 DIAGNOSIS — G4700 Insomnia, unspecified: Secondary | ICD-10-CM

## 2022-04-28 MED ORDER — AMOXICILLIN-POT CLAVULANATE 875-125 MG PO TABS: 1.0000 | ORAL_TABLET | Freq: Two times a day (BID) | ORAL | 0 refills | Status: AC

## 2022-04-28 MED ORDER — ZOLPIDEM TARTRATE 5 MG PO TABS: 5.0000 mg | ORAL_TABLET | Freq: Every evening | ORAL | 0 refills | Status: DC | PRN

## 2022-04-28 MED ORDER — TRAMADOL HCL 50 MG PO TABS: 50.0000 mg | ORAL_TABLET | Freq: Two times a day (BID) | ORAL | 0 refills | Status: AC | PRN

## 2022-04-28 NOTE — Progress Notes (Signed)
? ?Acute Office Visit ? ?Subjective:  ? ?  ?Patient ID: Monica Dillon, female    DOB: 07/10/1966, 56 y.o.   MRN: [id number] ? ?CC: dental infection  ? ? ?HPI ?Patient is in today for dental infection.  ? ?Patient reports she had a molar pulled on Monday. She developed an infection and went back to the dentist on Wednesday - at that time her sutures were removed so a tooth fragment could be removed. She has been on clindamycin since Monday, but it has not helped and symptoms have only worsened. This morning she went back to dentist and they told her to follow-up with PCP for a new antibiotic or go to the ED, so she scheduled to see [place] same-day. She reports the right sided facial swelling and pain is continuing to worse and the area inside her mouth is worsening as well. Pain at night can get up to 9-10/10 and during the day she is needing around-the-clock tylenol and ibuprofen and ice packs to keep it manageable.  ? ?She is also requesting a refill of her ambien for insomnia. She has not had a refill in over a year - uses very sparingly.  ? ? ?ROS ?All review of systems negative except what is listed in the HPI ? ? ?   ?Objective:  ?  ?BP 134/76   Pulse 83   Temp 97.7 ?F (36.5 ?C)   Ht 5\' 1"  (1.549 m)   Wt 132 lb 12.8 oz (60.2 kg)   LMP 01/08/2017   BMI 25.09 kg/m?  ? ? ?Physical Exam ?Vitals reviewed.  ?Constitutional:   ?   Appearance: Normal appearance.  ?HENT:  ?   Head:  ?   Comments: Right lower jaw with pain to palpation, inflammation, induration  ?   Mouth/Throat:  ? ?Skin: ?   General: Skin is warm and dry.  ?Neurological:  ?   General: No focal deficit present.  ?   Mental Status: She is alert and oriented to person, place, and time. Mental status is at baseline.  ?Psychiatric:     ?   Mood and Affect: Mood normal.     ?   Behavior: Behavior normal.     ?   Thought Content: Thought content normal.     ?   Judgment: Judgment normal.  ? ? ?No results found for any visits on 04/28/22. ? ? ?   ?Assessment &  Plan:  ? ?1. Dental infection ?Clindamycin not working - change to Augmentin. She is in significant pain with only minimal response to around-the-clock tylenol and ibuprofen. Adding a few days of Tramadol - educated on safety, use sparingly, do not take at the same time as Ambien. Continue ice for comfort, heat, soft foods, etc. Patient aware of signs/symptoms requiring further/urgent evaluation. PDMP reviewed.  ?- amoxicillin-clavulanate (AUGMENTIN) 875-125 MG tablet; Take 1 tablet by mouth 2 (two) times daily for 10 days.  Dispense: 20 tablet; Refill: 0 ?- traMADol (ULTRAM) 50 MG tablet; Take 1 tablet (50 mg total) by mouth every 12 (twelve) hours as needed for up to 5 days for moderate pain or severe pain.  Dispense: 10 tablet; Refill: 0 ? ?2. Insomnia, unspecified type ?Small refill on Ambien, she has not needed a refill in over a year - uses very sparingly. Safety discussed. PDMP reviewed.  ?- zolpidem (AMBIEN) 5 MG tablet; Take 1 tablet (5 mg total) by mouth at bedtime as needed for sleep.  Dispense: 15 tablet; Refill: 0 ?d ? ?  Return if symptoms worsen or fail to improve. ? ?Terrilyn Saver, NP ? ? ?

## 2022-04-28 NOTE — Patient Instructions (Signed)
Change your antibiotic to the Augmentin.  ?Adding a few days of tramadol for your severe pain - use sparingly. Do not use at the same time as Ambien. ? ?

## 2022-05-10 ENCOUNTER — Ambulatory Visit (INDEPENDENT_AMBULATORY_CARE_PROVIDER_SITE_OTHER): Payer: BC Managed Care – PPO | Admitting: Family

## 2022-05-10 ENCOUNTER — Telehealth: Payer: Self-pay

## 2022-05-10 VITALS — BP 122/76 | HR 70 | Temp 98.1°F | Resp 16 | Wt 130.0 lb

## 2022-05-10 DIAGNOSIS — J208 Acute bronchitis due to other specified organisms: Secondary | ICD-10-CM | POA: Insufficient documentation

## 2022-05-10 MED ORDER — PREDNISONE 10 MG PO TABS: ORAL_TABLET | ORAL | 0 refills | Status: DC

## 2022-05-10 MED ORDER — CHERATUSSIN AC 100-10 MG/5ML PO SOLN: 5.0000 mL | Freq: Three times a day (TID) | ORAL | 0 refills | Status: DC | PRN

## 2022-05-10 NOTE — Telephone Encounter (Signed)
Caller Name Petula Rotolo Caller Phone Number [phone] Patient Name Monica Dillon Patient DOB March 22, 1966 Call Type Message Only Information Provided Reason for Call Request to Schedule Office Appointment Initial Comment Caller states she is wanting to get a sick appointment Patient request to speak to RN No Additional Comment Provided office hours Disp. Time Disposition Final User 05/10/2022 7:41:19 AM General Information Provided Yes Talmadge Coventry Call Closed By: Talmadge Coventry Transaction Date/Time: 05/10/2022 7:39:18 AM (ET)

## 2022-05-10 NOTE — Telephone Encounter (Signed)
Pt scheduled for same day appointment with Melissa.

## 2022-05-10 NOTE — Patient Instructions (Signed)
Please begin prednisone taper. You may use cheratussin as needed for cough- but use when you are home.  Call if symptoms worsen or if symptoms are not improved in 2-3 days.
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
## 2022-05-10 NOTE — Assessment & Plan Note (Signed)
New.  She is having some improvement with prn albuterol and has responded well to oral steroids during previous similar episodes. Pt is advised as follows:  Please begin prednisone taper. You may use cheratussin as needed for cough- but use when you are home.  Call if symptoms worsen or if symptoms are not improved in 2-3 days.

## 2022-05-12 ENCOUNTER — Encounter: Payer: Self-pay | Admitting: Family

## 2022-05-12 MED ORDER — AZITHROMYCIN 250 MG PO TABS: ORAL_TABLET | ORAL | 0 refills | Status: AC

## 2022-06-07 ENCOUNTER — Emergency Department (HOSPITAL_BASED_OUTPATIENT_CLINIC_OR_DEPARTMENT_OTHER)
Admission: EM | Admit: 2022-06-07 | Discharge: 2022-06-07 | Disposition: A | Discharge status: Home / Self Care | Payer: BC Managed Care – PPO | Attending: Emergency Medicine | Admitting: Emergency Medicine

## 2022-06-07 ENCOUNTER — Emergency Department (HOSPITAL_BASED_OUTPATIENT_CLINIC_OR_DEPARTMENT_OTHER): Payer: BC Managed Care – PPO

## 2022-06-07 ENCOUNTER — Encounter (HOSPITAL_BASED_OUTPATIENT_CLINIC_OR_DEPARTMENT_OTHER): Payer: Self-pay | Admitting: Emergency Medicine

## 2022-06-07 ENCOUNTER — Other Ambulatory Visit: Payer: Self-pay

## 2022-06-07 ENCOUNTER — Other Ambulatory Visit (HOSPITAL_BASED_OUTPATIENT_CLINIC_OR_DEPARTMENT_OTHER): Payer: Self-pay

## 2022-06-07 DIAGNOSIS — J40 Bronchitis, not specified as acute or chronic: Secondary | ICD-10-CM | POA: Insufficient documentation

## 2022-06-07 DIAGNOSIS — R0602 Shortness of breath: Secondary | ICD-10-CM | POA: Diagnosis not present

## 2022-06-07 DIAGNOSIS — R059 Cough, unspecified: Secondary | ICD-10-CM | POA: Diagnosis not present

## 2022-06-07 DIAGNOSIS — Z20822 Contact with and (suspected) exposure to covid-19: Secondary | ICD-10-CM | POA: Insufficient documentation

## 2022-06-07 LAB — SARS CORONAVIRUS 2 BY RT PCR: SARS Coronavirus 2 by RT PCR: NEGATIVE

## 2022-06-07 MED ORDER — IPRATROPIUM BROMIDE HFA 17 MCG/ACT IN AERS
2.0000 | INHALATION_SPRAY | Freq: Once | RESPIRATORY_TRACT | Status: AC
  Administered 2022-06-07: 2 via RESPIRATORY_TRACT
  Filled 2022-06-07: qty 12.9

## 2022-06-07 MED ORDER — ALBUTEROL SULFATE HFA 108 (90 BASE) MCG/ACT IN AERS
4.0000 | INHALATION_SPRAY | Freq: Once | RESPIRATORY_TRACT | Status: AC
  Administered 2022-06-07: 4 via RESPIRATORY_TRACT
  Filled 2022-06-07: qty 6.7

## 2022-06-07 MED ORDER — AZITHROMYCIN 250 MG PO TABS
250.0000 mg | ORAL_TABLET | Freq: Every day | ORAL | 0 refills | Status: DC
  Filled 2022-06-07: qty 6, 5d supply, fill #0

## 2022-06-07 MED ORDER — PREDNISONE 50 MG PO TABS
60.0000 mg | ORAL_TABLET | Freq: Once | ORAL | Status: AC
  Administered 2022-06-07: 60 mg via ORAL
  Filled 2022-06-07: qty 1

## 2022-06-07 MED ORDER — PREDNISONE 20 MG PO TABS
60.0000 mg | ORAL_TABLET | Freq: Every day | ORAL | 0 refills | Status: AC
  Filled 2022-06-07: qty 12, 4d supply, fill #0

## 2022-06-07 NOTE — ED Triage Notes (Signed)
Pt reports cough and shortness of breath for the past 2 days. Reports she has had several episodes of this over the past few months that resolve with ABX and steroids. SpO2 92% on RA on arrival.

## 2022-06-07 NOTE — Discharge Instructions (Addendum)
Chest x-ray shows no evidence of pneumonia.  Overall suspect you have bronchitis/COPD exacerbation.  Recommend albuterol inhaler 4 puffs every 4 hours as needed.  Recommend Atrovent inhaler 2 puffs every 6 hours as needed.  Take next dose of steroid tomorrow.  I have also prescribed you an antibiotic to help with inflammation called Zithromax.

## 2022-06-07 NOTE — ED Provider Notes (Signed)
[facility] EMERGENCY DEPARTMENT Provider Note   CSN: [id number] Arrival date & time: 06/07/22  0732     History  Chief Complaint  Patient presents with   Cough   Shortness of Breath    Monica Dillon is a 56 y.o. female.  Patient with history of smoking who presents to the ED with cough, shortness of breath.  Symptoms for the last 2 days.  Nothing makes it worse or better.  She has tried inhaler without much relief.  No diagnosed history of COPD.  Denies any fevers or chills.  Negative COVID test yesterday.  No chest pain.  No recent surgery or travel.  Denies any fever, chills, sputum production.  Has had some congestion.  No abdominal pain, weakness, numbness.  The history is provided by the patient.       Home Medications Prior to Admission medications   Medication Sig Start Date End Date Taking? Authorizing Provider  azithromycin (ZITHROMAX) 250 MG tablet Take 1 tablet (250 mg total) by mouth daily. Take first 2 tablets together, then 1 every day until finished. 06/07/22  Yes Jarvis Knodel, DO  predniSONE (DELTASONE) 20 MG tablet Take 3 tablets (60 mg total) by mouth daily for 4 days. 06/07/22 06/11/22 Yes Reco Shonk, DO  acetaminophen (TYLENOL) 325 MG tablet Take 650 mg by mouth every 6 (six) hours as needed for headache.    [provider]  cetirizine (ZYRTEC) 10 MG tablet Take 1 tablet (10 mg total) by mouth 2 (two) times daily as needed for allergies. 10/21/18   Bradd Canary, MD  guaiFENesin-codeine (CHERATUSSIN AC) 100-10 MG/5ML syrup Take 5 mLs by mouth 3 (three) times daily as needed for cough. 05/10/22   Sandford Craze, NP  zolpidem (AMBIEN) 5 MG tablet Take 1 tablet (5 mg total) by mouth at bedtime as needed for sleep. 04/28/22   Clayborne Dana, NP      Allergies    Patient has no known allergies.    Review of Systems   Review of Systems  Physical Exam Updated Vital Signs BP (!) 134/92   Pulse 96   Temp 98.2 F (36.8 C) (Oral)    Resp 20   LMP 01/08/2017   SpO2 92%  Physical Exam Vitals and nursing note reviewed.  Constitutional:      General: She is not in acute distress.    Appearance: She is well-developed. She is not ill-appearing.  HENT:     Head: Normocephalic and atraumatic.     Nose: Nose normal.     Mouth/Throat:     Mouth: Mucous membranes are moist.  Eyes:     Extraocular Movements: Extraocular movements intact.     Conjunctiva/sclera: Conjunctivae normal.     Pupils: Pupils are equal, round, and reactive to light.  Cardiovascular:     Rate and Rhythm: Normal rate and regular rhythm.     Pulses: Normal pulses.     Heart sounds: Normal heart sounds. No murmur heard. Pulmonary:     Effort: Pulmonary effort is normal. No respiratory distress.     Breath sounds: Wheezing (mild faint expiratory wheezing) present.  Abdominal:     Palpations: Abdomen is soft.     Tenderness: There is no abdominal tenderness.  Musculoskeletal:        General: No swelling.     Cervical back: Neck supple.  Skin:    General: Skin is warm and dry.     Capillary Refill: Capillary refill takes less than 2  seconds.  Neurological:     General: No focal deficit present.     Mental Status: She is alert.  Psychiatric:        Mood and Affect: Mood normal.     ED Results / Procedures / Treatments   Labs (all labs ordered are listed, but only abnormal results are displayed) Labs Reviewed  SARS CORONAVIRUS 2 BY RT PCR    EKG None  Radiology DG Chest Portable 1 View  Result Date: 06/07/2022 CLINICAL DATA:  Provided history: Cough. Additional history provided: Cough, shortness of breath for 2 days. EXAM: PORTABLE CHEST 1 VIEW COMPARISON:  Prior chest radiographs 04/11/2019 and earlier. FINDINGS: Heart size within normal limits. No appreciable airspace consolidation or pulmonary edema. No evidence of pleural effusion or pneumothorax. No acute bony abnormality identified. Slight lower thoracic dextrocurvature.  IMPRESSION: No evidence of acute cardiopulmonary abnormality. Electronically Signed   By: Jackey Loge D.O.   On: 06/07/2022 08:06    Procedures Procedures    Medications Ordered in ED Medications  albuterol (VENTOLIN HFA) 108 (90 Base) MCG/ACT inhaler 4 puff (has no administration in time range)  ipratropium (ATROVENT HFA) inhaler 2 puff (has no administration in time range)  predniSONE (DELTASONE) tablet 60 mg (60 mg Oral Given 06/07/22 0749)    ED Course/ Medical Decision Making/ A&P                           Medical Decision Making Amount and/or Complexity of Data Reviewed Radiology: ordered.  Risk Prescription drug management.   Monica Dillon is here with cough.  Significant medical history of smoking.  Suspect undiagnosed COPD.  No other major medical problems.  Cough and some shortness of breath the last 2 days associated with congestion and URI symptoms.  Denies any fevers.  Denies any productive cough.  Has an inhaler that she is uses minimal relief.  No chest pain.  No concern for ACS or PE.  No cardiac risk factors.  She has mild wheezing and dry cough on exam.  I suspect that this is a URI/inflammatory process/bronchitis.  She is Wells criteria negative/0 and doubt PE.  She is not having any chest pain.  She had a COVID test yesterday that was negative.  We will do PCR COVID test to get chest x-ray, give breathing treatment and steroids.  Patient feeling better.  Chest x-ray per my review and interpretation shows no evidence of pneumonia.  Overall suspect bronchitis/COPD exacerbation.  No signs of respiratory distress.  Unremarkable vitals.  Normal room air oxygenation both at rest and with ambulation.  Will prescribe prednisone, Z-Pak.  Recommend continued use of inhaler.  Understands return precautions.  This chart was dictated using voice recognition software.  Despite best efforts to proofread,  errors can occur which can change the documentation meaning.        Final  Clinical Impression(s) / ED Diagnoses Final diagnoses:  Bronchitis    Rx / DC Orders ED Discharge Orders          Ordered    predniSONE (DELTASONE) 20 MG tablet  Daily        06/07/22 0814    azithromycin (ZITHROMAX) 250 MG tablet  Daily        06/07/22 0814              Virgina Norfolk, DO 06/07/22 [phone]

## 2022-07-13 DIAGNOSIS — Z1231 Encounter for screening mammogram for malignant neoplasm of breast: Secondary | ICD-10-CM | POA: Diagnosis not present

## 2022-07-13 LAB — HM MAMMOGRAPHY

## 2022-08-02 ENCOUNTER — Encounter: Payer: Self-pay | Admitting: *Deleted

## 2023-02-28 NOTE — Assessment & Plan Note (Deleted)
Avoid offending foods, start probiotics. Do not eat large meals in late evening and consider raising head of bed.  

## 2023-02-28 NOTE — Assessment & Plan Note (Deleted)
Encouraged heart healthy diet, increase exercise, avoid trans fats, consider a krill oil cap daily. Has worsened, consider medication if no irmpvoement after next set of labs

## 2023-02-28 NOTE — Assessment & Plan Note (Deleted)
hgba1c acceptable, minimize simple carbs. Increase exercise as tolerated.  

## 2023-02-28 NOTE — Assessment & Plan Note (Deleted)
Patient encouraged to maintain heart healthy diet, regular exercise, adequate sleep. Consider daily probiotics. Take medications as prescribed. Labs ordered and reviewed.  Given and reviewed copy of ACP documents from [place] [facility] [place] and encouraged to complete and return mgm 06/2022 repeat after 06/2023. Colonoscopy none, cologuard 2019 repeat or proceed with colonoscopy Pap 10/2019

## 2023-03-01 ENCOUNTER — Encounter: Payer: BC Managed Care – PPO | Admitting: Family Medicine

## 2023-03-01 DIAGNOSIS — Z0000 Encounter for general adult medical examination without abnormal findings: Secondary | ICD-10-CM

## 2023-03-01 DIAGNOSIS — E782 Mixed hyperlipidemia: Secondary | ICD-10-CM

## 2023-03-01 DIAGNOSIS — R739 Hyperglycemia, unspecified: Secondary | ICD-10-CM

## 2023-03-01 DIAGNOSIS — K219 Gastro-esophageal reflux disease without esophagitis: Secondary | ICD-10-CM

## 2023-04-29 ENCOUNTER — Telehealth: Payer: BC Managed Care – PPO | Admitting: Nurse Practitioner

## 2023-04-29 DIAGNOSIS — J40 Bronchitis, not specified as acute or chronic: Secondary | ICD-10-CM | POA: Diagnosis not present

## 2023-04-29 MED ORDER — AZITHROMYCIN 250 MG PO TABS: 250.0000 mg | ORAL_TABLET | Freq: Every day | ORAL | 0 refills | Status: DC

## 2023-04-29 MED ORDER — CETIRIZINE HCL 10 MG PO TABS: 10.0000 mg | ORAL_TABLET | Freq: Two times a day (BID) | ORAL | 0 refills | Status: DC | PRN

## 2023-04-29 MED ORDER — PROMETHAZINE-DM 6.25-15 MG/5ML PO SYRP: 5.0000 mL | ORAL_SOLUTION | Freq: Four times a day (QID) | ORAL | 0 refills | Status: DC | PRN

## 2023-04-29 MED ORDER — PREDNISONE 20 MG PO TABS: 20.0000 mg | ORAL_TABLET | Freq: Every day | ORAL | 0 refills | Status: DC

## 2023-04-29 NOTE — Progress Notes (Signed)
Virtual Visit Consent   Posey Jacobe, you are scheduled for a virtual visit with a [facility] provider today. Just as with appointments in the office, your consent must be obtained to participate. Your consent will be active for this visit and any virtual visit you may have with one of our providers in the next 365 days. If you have a MyChart account, a copy of this consent can be sent to you electronically.  As this is a virtual visit, video technology does not allow for your provider to perform a traditional examination. This may limit your provider's ability to fully assess your condition. If your provider identifies any concerns that need to be evaluated in person or the need to arrange testing (such as labs, EKG, etc.), we will make arrangements to do so. Although advances in technology are sophisticated, we cannot ensure that it will always work on either your end or our end. If the connection with a video visit is poor, the visit may have to be switched to a telephone visit. With either a video or telephone visit, we are not always able to ensure that we have a secure connection.  By engaging in this virtual visit, you consent to the provision of healthcare and authorize for your insurance to be billed (if applicable) for the services provided during this visit. Depending on your insurance coverage, you may receive a charge related to this service.  I need to obtain your verbal consent now. Are you willing to proceed with your visit today? So Freyre has provided verbal consent on 04/29/2023 for a virtual visit (video or telephone). Claiborne Rigg, NP  Date: 04/29/2023 10:13 AM  Virtual Visit via Video Note   I, Claiborne Rigg, connected with  Monica Dillon  ([id number], [birth year]) on 04/29/23 at 10:15 AM EDT by a video-enabled telemedicine application and verified that I am speaking with the correct person using two identifiers.  Location: Patient: Virtual Visit Location Patient:  Home Provider: Virtual Visit Location Provider: Home Office   I discussed the limitations of evaluation and management by telemedicine and the availability of in person appointments. The patient expressed understanding and agreed to proceed.    History of Present Illness: Monica Dillon is a 57 y.o. who identifies as a female who was assigned female at birth, and is being seen today for BRONCHITIS.  Acute Bronchitis: Patient presents for presents evaluation of dyspnea, headache and sinus pressure with post nasal drainage, nasal congestion, and productive cough with sputum described as mucoid and tenacious. Symptoms began several days ago and are gradually worsening since that time.  Past history is significant for  bronchitis . Taking zyrtec, mucinex sinus Max with no improvement in symptoms.   Problems:  Patient Active Problem List   Diagnosis Date Noted   TOS (thoracic outlet syndrome) 01/08/2017   Hyperlipidemia 01/08/2017   Hyperglycemia 10/15/2016   Leukocytosis 10/15/2016   Cervical cancer screening 10/12/2016   H/O tobacco use, presenting hazards to health 10/12/2016   Cough 05/07/2016   Insomnia 02/06/2016   Preventative health care 02/06/2016   Anxiety and depression 02/06/2016   Allergy    GERD (gastroesophageal reflux disease)    History of chicken pox     Allergies: No Known Allergies Medications:  Current Outpatient Medications:    predniSONE (DELTASONE) 20 MG tablet, Take 1 tablet (20 mg total) by mouth daily with breakfast for 5 days., Disp: 5 tablet, Rfl: 0   promethazine-dextromethorphan (PROMETHAZINE-DM) 6.25-15 MG/5ML syrup, Take 5 mLs  by mouth 4 (four) times daily as needed for cough., Disp: 240 mL, Rfl: 0   acetaminophen (TYLENOL) 325 MG tablet, Take 650 mg by mouth every 6 (six) hours as needed for headache., Disp: , Rfl:    azithromycin (ZITHROMAX) 250 MG tablet, Take 1 tablet (250 mg total) by mouth daily. Take first 2 tablets together, then 1 every day until  finished., Disp: 6 tablet, Rfl: 0   cetirizine (ZYRTEC) 10 MG tablet, Take 1 tablet (10 mg total) by mouth 2 (two) times daily as needed for allergies., Disp: 60 tablet, Rfl: 0   guaiFENesin-codeine (CHERATUSSIN AC) 100-10 MG/5ML syrup, Take 5 mLs by mouth 3 (three) times daily as needed for cough., Disp: 75 mL, Rfl: 0   zolpidem (AMBIEN) 5 MG tablet, Take 1 tablet (5 mg total) by mouth at bedtime as needed for sleep., Disp: 15 tablet, Rfl: 0  Observations/Objective: Patient is well-developed, well-nourished in no acute distress.  Resting comfortably  at home.  Head is normocephalic, atraumatic.  No labored breathing.  Speech is clear and coherent with logical content.  Patient is alert and oriented at baseline.    Assessment and Plan: 1. Bronchitis - cetirizine (ZYRTEC) 10 MG tablet; Take 1 tablet (10 mg total) by mouth 2 (two) times daily as needed for allergies.  Dispense: 60 tablet; Refill: 0 - azithromycin (ZITHROMAX) 250 MG tablet; Take 1 tablet (250 mg total) by mouth daily. Take first 2 tablets together, then 1 every day until finished.  Dispense: 6 tablet; Refill: 0 - promethazine-dextromethorphan (PROMETHAZINE-DM) 6.25-15 MG/5ML syrup; Take 5 mLs by mouth 4 (four) times daily as needed for cough.  Dispense: 240 mL; Refill: 0 - predniSONE (DELTASONE) 20 MG tablet; Take 1 tablet (20 mg total) by mouth daily with breakfast for 5 days.  Dispense: 5 tablet; Refill: 0  INSTRUCTIONS: use a humidifier for nasal congestion Drink plenty of fluids, rest and wash hands frequently to avoid the spread of infection Alternate tylenol and Motrin for relief of fever   Follow Up Instructions: I discussed the assessment and treatment plan with the patient. The patient was provided an opportunity to ask questions and all were answered. The patient agreed with the plan and demonstrated an understanding of the instructions.  A copy of instructions were sent to the patient via MyChart unless otherwise  noted below.    The patient was advised to call back or seek an in-person evaluation if the symptoms worsen or if the condition fails to improve as anticipated.  Time:  I spent 12 minutes with the patient via telehealth technology discussing the above problems/concerns.    Claiborne Rigg, NP

## 2023-04-29 NOTE — Patient Instructions (Signed)
[name], thank you for joining Claiborne Rigg, NP for today's virtual visit.  While this provider is not your primary care provider (PCP), if your PCP is located in our provider database this encounter information will be shared with them immediately following your visit.   A [facility] MyChart account gives you access to today's visit and all your visits, tests, and labs performed at [facility] " click here if you don't have a [facility] MyChart account or go to [email or web address]  Consent: (Patient) Monica Dillon provided verbal consent for this virtual visit at the beginning of the encounter.  Current Medications:  Current Outpatient Medications:    predniSONE (DELTASONE) 20 MG tablet, Take 1 tablet (20 mg total) by mouth daily with breakfast for 5 days., Disp: 5 tablet, Rfl: 0   promethazine-dextromethorphan (PROMETHAZINE-DM) 6.25-15 MG/5ML syrup, Take 5 mLs by mouth 4 (four) times daily as needed for cough., Disp: 240 mL, Rfl: 0   acetaminophen (TYLENOL) 325 MG tablet, Take 650 mg by mouth every 6 (six) hours as needed for headache., Disp: , Rfl:    azithromycin (ZITHROMAX) 250 MG tablet, Take 1 tablet (250 mg total) by mouth daily. Take first 2 tablets together, then 1 every day until finished., Disp: 6 tablet, Rfl: 0   cetirizine (ZYRTEC) 10 MG tablet, Take 1 tablet (10 mg total) by mouth 2 (two) times daily as needed for allergies., Disp: 60 tablet, Rfl: 0   guaiFENesin-codeine (CHERATUSSIN AC) 100-10 MG/5ML syrup, Take 5 mLs by mouth 3 (three) times daily as needed for cough., Disp: 75 mL, Rfl: 0   zolpidem (AMBIEN) 5 MG tablet, Take 1 tablet (5 mg total) by mouth at bedtime as needed for sleep., Disp: 15 tablet, Rfl: 0   Medications ordered in this encounter:  Meds ordered this encounter  Medications   cetirizine (ZYRTEC) 10 MG tablet    Sig: Take 1 tablet (10 mg total) by mouth 2 (two) times daily as needed for allergies.    Dispense:  60 tablet     Refill:  0    Order Specific Question:   Supervising Provider    Answer:   Merrilee Jansky [[id number]]   azithromycin (ZITHROMAX) 250 MG tablet    Sig: Take 1 tablet (250 mg total) by mouth daily. Take first 2 tablets together, then 1 every day until finished.    Dispense:  6 tablet    Refill:  0    Order Specific Question:   Supervising Provider    Answer:   Merrilee Jansky [id number]   promethazine-dextromethorphan (PROMETHAZINE-DM) 6.25-15 MG/5ML syrup    Sig: Take 5 mLs by mouth 4 (four) times daily as needed for cough.    Dispense:  240 mL    Refill:  0    Order Specific Question:   Supervising Provider    Answer:   Merrilee Jansky [[id number]]   predniSONE (DELTASONE) 20 MG tablet    Sig: Take 1 tablet (20 mg total) by mouth daily with breakfast for 5 days.    Dispense:  5 tablet    Refill:  0    Order Specific Question:   Supervising Provider    Answer:   Merrilee Jansky [id number]     *If you need refills on other medications prior to your next appointment, please contact your pharmacy*  Follow-Up: Call back or seek an in-person evaluation if the symptoms worsen or if the condition fails to improve as anticipated.  [facility] (  [phone]  Other Instructions INSTRUCTIONS: use a humidifier for nasal congestion Drink plenty of fluids, rest and wash hands frequently to avoid the spread of infection Alternate tylenol and Motrin for relief of fever    If you have been instructed to have an in-person evaluation today at a local Urgent Care facility, please use the link below. It will take you to a list of all of our available [facility], including address, phone number and hours of operation. Please do not delay care.  [facility]  If you or a family member do not have a primary care provider, use the link below to schedule a visit and establish care. When you choose a [facility] primary care physician or advanced practice  provider, you gain a long-term partner in health. Find a Primary Care Provider  Learn more about [facility]'s in-office and virtual care options:  - Get Care Now

## 2023-05-03 ENCOUNTER — Telehealth (INDEPENDENT_AMBULATORY_CARE_PROVIDER_SITE_OTHER): Payer: BC Managed Care – PPO | Admitting: Family Medicine

## 2023-05-03 ENCOUNTER — Ambulatory Visit: Payer: BC Managed Care – PPO | Admitting: Family Medicine

## 2023-05-03 VITALS — BP 126/80 | HR 84 | Temp 97.9°F | Resp 16 | Ht 61.0 in | Wt 131.2 lb

## 2023-05-03 DIAGNOSIS — J209 Acute bronchitis, unspecified: Secondary | ICD-10-CM | POA: Diagnosis not present

## 2023-05-03 MED ORDER — PREDNISONE 20 MG PO TABS: ORAL_TABLET | ORAL | 0 refills | Status: DC

## 2023-05-03 MED ORDER — DOXYCYCLINE HYCLATE 100 MG PO CAPS: 100.0000 mg | ORAL_CAPSULE | Freq: Two times a day (BID) | ORAL | 0 refills | Status: DC
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
## 2023-05-23 ENCOUNTER — Telehealth: Payer: Self-pay | Admitting: Family

## 2023-05-23 ENCOUNTER — Other Ambulatory Visit (HOSPITAL_COMMUNITY)
Admission: RE | Admit: 2023-05-23 | Discharge: 2023-05-23 | Disposition: A | Discharge status: Home / Self Care | Payer: BC Managed Care – PPO | Source: Ambulatory Visit | Attending: Family Medicine | Admitting: Family Medicine

## 2023-05-23 ENCOUNTER — Encounter: Payer: Self-pay | Admitting: Family

## 2023-05-23 ENCOUNTER — Ambulatory Visit (INDEPENDENT_AMBULATORY_CARE_PROVIDER_SITE_OTHER): Payer: BC Managed Care – PPO | Admitting: Family

## 2023-05-23 VITALS — BP 119/74 | HR 89 | Temp 98.4°F | Resp 16 | Ht 60.0 in | Wt 129.0 lb

## 2023-05-23 DIAGNOSIS — Z0000 Encounter for general adult medical examination without abnormal findings: Secondary | ICD-10-CM | POA: Diagnosis not present

## 2023-05-23 DIAGNOSIS — E782 Mixed hyperlipidemia: Secondary | ICD-10-CM | POA: Diagnosis not present

## 2023-05-23 DIAGNOSIS — F17210 Nicotine dependence, cigarettes, uncomplicated: Secondary | ICD-10-CM

## 2023-05-23 DIAGNOSIS — J40 Bronchitis, not specified as acute or chronic: Secondary | ICD-10-CM

## 2023-05-23 DIAGNOSIS — Z720 Tobacco use: Secondary | ICD-10-CM | POA: Diagnosis not present

## 2023-05-23 DIAGNOSIS — Z1159 Encounter for screening for other viral diseases: Secondary | ICD-10-CM

## 2023-05-23 DIAGNOSIS — Z1211 Encounter for screening for malignant neoplasm of colon: Secondary | ICD-10-CM

## 2023-05-23 DIAGNOSIS — Z01419 Encounter for gynecological examination (general) (routine) without abnormal findings: Secondary | ICD-10-CM

## 2023-05-23 DIAGNOSIS — J209 Acute bronchitis, unspecified: Secondary | ICD-10-CM

## 2023-05-23 DIAGNOSIS — R739 Hyperglycemia, unspecified: Secondary | ICD-10-CM | POA: Diagnosis not present

## 2023-05-23 DIAGNOSIS — D582 Other hemoglobinopathies: Secondary | ICD-10-CM | POA: Diagnosis not present

## 2023-05-23 DIAGNOSIS — Z114 Encounter for screening for human immunodeficiency virus [HIV]: Secondary | ICD-10-CM | POA: Diagnosis not present

## 2023-05-23 LAB — HEMOGLOBIN A1C: Hgb A1c MFr Bld: 6.1 % (ref 4.6–6.5)

## 2023-05-23 LAB — CBC WITH DIFFERENTIAL/PLATELET
Basophils Absolute: 0.1 10*3/uL (ref 0.0–0.1)
Basophils Relative: 0.9 % (ref 0.0–3.0)
Eosinophils Absolute: 0.3 10*3/uL (ref 0.0–0.7)
Eosinophils Relative: 3.4 % (ref 0.0–5.0)
HCT: 43.9 % (ref 36.0–46.0)
Hemoglobin: 14.8 g/dL (ref 12.0–15.0)
Lymphocytes Relative: 29.2 % (ref 12.0–46.0)
Lymphs Abs: 2.5 10*3/uL (ref 0.7–4.0)
MCHC: 33.8 g/dL (ref 30.0–36.0)
MCV: 93.2 fl (ref 78.0–100.0)
Monocytes Absolute: 0.5 10*3/uL (ref 0.1–1.0)
Monocytes Relative: 6.5 % (ref 3.0–12.0)
Neutro Abs: 5.1 10*3/uL (ref 1.4–7.7)
Neutrophils Relative %: 60 % (ref 43.0–77.0)
Platelets: 348 10*3/uL (ref 150.0–400.0)
RBC: 4.71 Mil/uL (ref 3.87–5.11)
RDW: 12.4 % (ref 11.5–15.5)
WBC: 8.5 10*3/uL (ref 4.0–10.5)

## 2023-05-23 LAB — LIPID PANEL
Cholesterol: 222 mg/dL — ABNORMAL HIGH (ref 0–200)
HDL: 32.4 mg/dL — ABNORMAL LOW (ref >39.00)
NonHDL: 189.32
Total CHOL/HDL Ratio: 7
Triglycerides: 278 mg/dL — ABNORMAL HIGH (ref 0.0–149.0)
VLDL: 55.6 mg/dL — ABNORMAL HIGH (ref 0.0–40.0)

## 2023-05-23 LAB — COMPREHENSIVE METABOLIC PANEL
ALT: 20 U/L (ref 0–35)
AST: 17 U/L (ref 0–37)
Albumin: 4.4 g/dL (ref 3.5–5.2)
Alkaline Phosphatase: 57 U/L (ref 39–117)
BUN: 9 mg/dL (ref 6–23)
CO2: 27 mEq/L (ref 19–32)
Calcium: 9.7 mg/dL (ref 8.4–10.5)
Chloride: 100 mEq/L (ref 96–112)
Creatinine, Ser: 0.71 mg/dL (ref 0.40–1.20)
GFR: 94.84 mL/min (ref >60.00)
Glucose, Bld: 93 mg/dL (ref 70–99)
Potassium: 4.8 mEq/L (ref 3.5–5.1)
Sodium: 142 mEq/L (ref 135–145)
Total Bilirubin: 0.4 mg/dL (ref 0.2–1.2)
Total Protein: 7.4 g/dL (ref 6.0–8.3)

## 2023-05-23 LAB — FERRITIN: Ferritin: 37.8 ng/mL (ref 10.0–291.0)

## 2023-05-23 LAB — LDL CHOLESTEROL, DIRECT: Direct LDL: 170 mg/dL

## 2023-05-23 MED ORDER — PREDNISONE 10 MG PO TABS
ORAL_TABLET | ORAL | 0 refills | Status: DC
Start: 2023-05-23 — End: 2023-12-10

## 2023-05-23 MED ORDER — VARENICLINE TARTRATE (STARTER) 0.5 MG X 11 & 1 MG X 42 PO TBPK
ORAL_TABLET | ORAL | 0 refills | Status: DC
Start: 2023-05-23 — End: 2023-06-11

## 2023-05-23 MED ORDER — ALBUTEROL SULFATE HFA 108 (90 BASE) MCG/ACT IN AERS: 2.0000 | INHALATION_SPRAY | Freq: Four times a day (QID) | RESPIRATORY_TRACT | 0 refills | Status: DC | PRN

## 2023-05-23 NOTE — Telephone Encounter (Signed)
noted 

## 2023-05-23 NOTE — Telephone Encounter (Signed)
See mychart.  

## 2023-05-23 NOTE — Telephone Encounter (Signed)
Never mind- it was overdue anyway and we opted for colonoscopy instead.

## 2023-05-23 NOTE — Assessment & Plan Note (Signed)
She is motivated to quit.  Approximately 40 pack year history.  Trial of chantix. Common side effects including rare risk of suicide ideation was discussed with the patient today.  Patient is instructed to go directly to the ED if this occurs.  We discussed that patient can continue to smoke for 1 week after starting chantix, but then must discontinue cigarettes.  He is also instructed to contact us prior to completion of the starter month pack for an rx for the continuation month pack.  5 minutes spent with patient today on tobacco cessation counseling.
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
## 2023-05-23 NOTE — Assessment & Plan Note (Signed)
New.  Already completed abx but lungs feel tight and she has wheezing on exam. Will rx with a prednisone taper.

## 2023-05-23 NOTE — Telephone Encounter (Signed)
Please call cologuard and request copy of last test result. tks

## 2023-05-23 NOTE — Assessment & Plan Note (Signed)
Continue regular exercise, encouraged healthy diet.  I updated her family hx (not was made of father having hx of cancer but type not specified).  Pt states dad had colon cancer.  I advised her that she is not a good candidate for cologuard and recommended colonoscopy instead which she is agreeable to complete. Pap performed today. Recommended flu shot and covid booster this fall.

## 2023-05-24 ENCOUNTER — Encounter: Payer: BC Managed Care – PPO | Admitting: Family Medicine

## 2023-05-24 LAB — HEPATITIS C ANTIBODY: Hepatitis C Ab: NONREACTIVE

## 2023-05-24 LAB — HIV ANTIBODY (ROUTINE TESTING W REFLEX): HIV 1&2 Ab, 4th Generation: NONREACTIVE

## 2023-05-25 ENCOUNTER — Telehealth: Payer: Self-pay | Admitting: Family

## 2023-05-25 DIAGNOSIS — E785 Hyperlipidemia, unspecified: Secondary | ICD-10-CM

## 2023-05-25 MED ORDER — ATORVASTATIN CALCIUM 20 MG PO TABS: 20.0000 mg | ORAL_TABLET | Freq: Every day | ORAL | 1 refills | Status: DC

## 2023-05-25 NOTE — Telephone Encounter (Addendum)
Lab work is reviewed.  Her triglycerides and cholesterol are elevated.  I would recommend that she continue to work on a low fat diet, exercise and minimizing sweets/white carbs.  I also would recommend that she begin atorvastatin to help bring her levels down and decrease heart risk. Repeat lipids in 6 weeks.   Sugar is in the borderline range for diabetes.    Iron level looks good.

## 2023-05-28 NOTE — Telephone Encounter (Signed)
Patient advised of results and provider's comments. She will start medication as indicated and work on her diet. She was scheduled to come back for labs in late July.

## 2023-05-28 NOTE — Telephone Encounter (Signed)
Called patient but no answer, left voice mail for patient to call back.   

## 2023-05-31 LAB — CYTOLOGY - PAP
Comment: NEGATIVE
Diagnosis: NEGATIVE
High risk HPV: NEGATIVE

## 2023-06-06 ENCOUNTER — Ambulatory Visit (HOSPITAL_BASED_OUTPATIENT_CLINIC_OR_DEPARTMENT_OTHER): Payer: BC Managed Care – PPO

## 2023-06-11 ENCOUNTER — Encounter: Payer: Self-pay | Admitting: Family

## 2023-06-11 ENCOUNTER — Encounter: Payer: Self-pay | Admitting: Family Medicine

## 2023-06-11 MED ORDER — VARENICLINE TARTRATE 1 MG PO TABS: 1.0000 mg | ORAL_TABLET | Freq: Two times a day (BID) | ORAL | 1 refills | Status: DC

## 2023-06-14 ENCOUNTER — Other Ambulatory Visit: Payer: Self-pay | Admitting: Family

## 2023-07-07 ENCOUNTER — Other Ambulatory Visit: Payer: Self-pay | Admitting: Family

## 2023-07-07 DIAGNOSIS — Z720 Tobacco use: Secondary | ICD-10-CM

## 2023-07-07 MED ORDER — VARENICLINE TARTRATE 1 MG PO TABS: 1.0000 mg | ORAL_TABLET | Freq: Two times a day (BID) | ORAL | 1 refills | Status: DC

## 2023-07-08 ENCOUNTER — Other Ambulatory Visit: Payer: Self-pay | Admitting: Family Medicine

## 2023-07-11 ENCOUNTER — Other Ambulatory Visit: Payer: BC Managed Care – PPO

## 2023-08-10 NOTE — Progress Notes (Signed)
This encounter was created in error - please disregard.  Looks like no labs done on that day. Partially prechrted but labs cancelled.

## 2023-08-15 ENCOUNTER — Other Ambulatory Visit: Payer: Self-pay | Admitting: Family

## 2023-12-10 ENCOUNTER — Ambulatory Visit: Payer: BC Managed Care – PPO | Admitting: Family Medicine

## 2023-12-10 VITALS — BP 112/78 | HR 84 | Temp 97.6°F | Wt 128.2 lb

## 2023-12-10 DIAGNOSIS — J40 Bronchitis, not specified as acute or chronic: Secondary | ICD-10-CM | POA: Diagnosis not present

## 2023-12-10 DIAGNOSIS — J029 Acute pharyngitis, unspecified: Secondary | ICD-10-CM | POA: Diagnosis not present

## 2023-12-10 DIAGNOSIS — R051 Acute cough: Secondary | ICD-10-CM

## 2023-12-10 LAB — POC INFLUENZA A&B (BINAX/QUICKVUE)
Influenza A, POC: NEGATIVE
Influenza B, POC: NEGATIVE

## 2023-12-10 LAB — POCT RAPID STREP A (OFFICE): Rapid Strep A Screen: NEGATIVE

## 2023-12-10 LAB — POC COVID19 BINAXNOW: SARS Coronavirus 2 Ag: NEGATIVE

## 2023-12-10 MED ORDER — PREDNISONE 10 MG PO TABS: ORAL_TABLET | ORAL | 0 refills | Status: DC

## 2023-12-10 MED ORDER — BENZONATATE 200 MG PO CAPS: 200.0000 mg | ORAL_CAPSULE | Freq: Two times a day (BID) | ORAL | 0 refills | Status: DC | PRN

## 2023-12-10 MED ORDER — DOXYCYCLINE HYCLATE 100 MG PO TABS: 100.0000 mg | ORAL_TABLET | Freq: Two times a day (BID) | ORAL | 0 refills | Status: AC

## 2023-12-10 NOTE — Patient Instructions (Addendum)
Return in about 2 weeks (around 12/24/2023), or if symptoms worsen or fail to improve, for w/ pcp.        Great to see you today.  I have refilled the medication(s) we provide.   If labs were collected or images ordered, we will inform you of  results once we have received them and reviewed. We will contact you either by echart message, or telephone call.  Please give ample time to the testing facility, and our office to run,  receive and review results. Please do not call inquiring of results, even if you can see them in your chart. We will contact you as soon as we are able. If it has been over 1 week since the test was completed, and you have not yet heard from [place], then please call us.    - echart message- for normal results that have been seen by the patient already.   - telephone call: abnormal results or if patient has not viewed results in their echart.  If a referral to a specialist was entered for you, please call us in 2 weeks if you have not heard from the specialist office to schedule.
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
## 2024-02-14 ENCOUNTER — Ambulatory Visit (INDEPENDENT_AMBULATORY_CARE_PROVIDER_SITE_OTHER): Payer: BC Managed Care – PPO | Admitting: Family Medicine

## 2024-02-14 ENCOUNTER — Encounter: Payer: Self-pay | Admitting: Family Medicine

## 2024-02-14 VITALS — BP 106/76 | HR 86 | Ht 60.0 in | Wt 130.0 lb

## 2024-02-14 DIAGNOSIS — S29012A Strain of muscle and tendon of back wall of thorax, initial encounter: Secondary | ICD-10-CM | POA: Diagnosis not present

## 2024-02-14 MED ORDER — CYCLOBENZAPRINE HCL 5 MG PO TABS: 5.0000 mg | ORAL_TABLET | Freq: Three times a day (TID) | ORAL | 1 refills | Status: DC | PRN

## 2024-02-14 MED ORDER — PREDNISONE 20 MG PO TABS: 40.0000 mg | ORAL_TABLET | Freq: Every day | ORAL | 0 refills | Status: AC

## 2024-02-14 NOTE — Progress Notes (Signed)
 Acute Office Visit  Subjective:     Patient ID: Monica Dillon, female    DOB: 1966-07-18, 58 y.o.   MRN: [id number]  Chief Complaint  Patient presents with   Back Pain    Patient is in today for back pain.  Discussed the use of AI scribe software for clinical note transcription with the patient, who gave verbal consent to proceed.  History of Present Illness Monica Dillon is a 58 year old female who presents with mid-back pain on the right side for a couple of weeks.  She has been experiencing sharp mid-back pain on the right side for the past couple of weeks, which is higher than her usual lower back pain. The pain began during a workday while performing desk work. It is slightly alleviated by Advil, which she takes as needed, but Tylenol was ineffective. The pain does not completely resolve with medication. Twisting motions exacerbate the pain, while bending forward or stretching back does not affect it.  There is no associated urinary symptoms such as burning, urgency, frequency, or hematuria. No numbness, tingling, or shooting pain, except for a recent episode of mild shoulder discomfort. No neck pain, nausea, vomiting, diarrhea, rashes, fever, chills, or body aches.   She works a Office manager.           All review of systems negative except what is listed in the HPI      Objective:    BP 106/76   Pulse 86   Ht 5' (1.524 m)   Wt 130 lb (59 kg)   LMP 01/08/2017   SpO2 97%   BMI 25.39 kg/m    Physical Exam Vitals reviewed.  Constitutional:      Appearance: Normal appearance.  Musculoskeletal:        General: No swelling. Normal range of motion.     Comments: Right thoracic paraspinal muscle tension and tenderness to palpation, no skin changes, no CVA tenderness, normal range of motion, but pain with twisting left/right   Neurological:     Mental Status: She is alert and oriented to person, place, and time.     Gait: Gait normal.  Psychiatric:        Behavior:  Behavior normal.        Thought Content: Thought content normal.        Judgment: Judgment normal.        No results found for any visits on 02/14/24.      Assessment & Plan:   Problem List Items Addressed This Visit   None Visit Diagnoses       Strain of thoracic back region    -  Primary   Relevant Medications   predniSONE (DELTASONE) 20 MG tablet   cyclobenzaprine (FLEXERIL) 5 MG tablet      Assessment & Plan Thoracic Back Strain Right-sided mid-back pain for a few weeks, exacerbated by twisting and palpation. No urinary changes, no radiating pain, numbness, or tingling.  -Start Prednisone 40mg  daily for 5 days to reduce inflammation. If side effects are too intense, reduce to 20mg  daily. -Start a muscle relaxer as needed, especially at the end of the day. Avoid use during the day if it causes drowsiness. -Implement heat, ice, massage, and stretching at home. -If no improvement after 4 weeks, consider formal physical therapy and possible x-ray. -If numbness, tingling, or shooting pain develops, contact the office immediately.    Meds ordered this encounter  Medications   predniSONE (DELTASONE) 20 MG tablet  Sig: Take 2 tablets (40 mg total) by mouth daily with breakfast for 5 days.    Dispense:  10 tablet    Refill:  0    Supervising Provider:   Danise Edge A [[id number]]   cyclobenzaprine (FLEXERIL) 5 MG tablet    Sig: Take 1 tablet (5 mg total) by mouth 3 (three) times daily as needed for muscle spasms.    Dispense:  30 tablet    Refill:  1    Supervising Provider:   Danise Edge A [[id number]]    Return if symptoms worsen or fail to improve.  Clayborne Dana, NP

## 2024-03-12 ENCOUNTER — Encounter: Payer: Self-pay | Admitting: Physician Assistant

## 2024-03-12 ENCOUNTER — Ambulatory Visit (INDEPENDENT_AMBULATORY_CARE_PROVIDER_SITE_OTHER): Admitting: Physician Assistant

## 2024-03-12 VITALS — BP 112/82 | HR 93 | Temp 97.6°F | Resp 16 | Ht 60.0 in | Wt 129.8 lb

## 2024-03-12 DIAGNOSIS — J0100 Acute maxillary sinusitis, unspecified: Secondary | ICD-10-CM

## 2024-03-12 MED ORDER — AZITHROMYCIN 250 MG PO TABS: ORAL_TABLET | ORAL | 0 refills | Status: AC

## 2024-03-12 MED ORDER — PREDNISONE 20 MG PO TABS: 20.0000 mg | ORAL_TABLET | Freq: Every day | ORAL | 0 refills | Status: DC

## 2024-03-12 MED ORDER — AZELASTINE HCL 0.1 % NA SOLN: 1.0000 | Freq: Two times a day (BID) | NASAL | 0 refills | Status: DC

## 2024-03-12 NOTE — Progress Notes (Signed)
 Established patient visit   Patient: Monica Dillon   DOB: 1966/07/06   58 y.o. Female  MRN: [id number] Visit Date: 03/12/2024  Today's healthcare provider: Alfredia Ferguson, PA-C   Chief Complaint  Patient presents with   Cough    X1 week, dry cough, sinus congestion, no fever, pt states using zyrtec twice daily.    Subjective     Pt reports x 1 week of dry cough, sinus congestion, denies fever. Taking zyrtec otc BID.  She reports her allergy symptoms are now associated with significant sinus pressure. Medications: Outpatient Medications Prior to Visit  Medication Sig   acetaminophen (TYLENOL) 325 MG tablet Take 650 mg by mouth every 6 (six) hours as needed for headache.   albuterol (VENTOLIN HFA) 108 (90 Base) MCG/ACT inhaler TAKE 2 PUFFS BY MOUTH EVERY 6 HOURS AS NEEDED FOR WHEEZE OR SHORTNESS OF BREATH   cetirizine (ZYRTEC) 10 MG tablet Take 1 tablet (10 mg total) by mouth 2 (two) times daily as needed for allergies.   cyclobenzaprine (FLEXERIL) 5 MG tablet Take 1 tablet (5 mg total) by mouth 3 (three) times daily as needed for muscle spasms.   No facility-administered medications prior to visit.    Review of Systems  Constitutional:  Positive for fatigue. Negative for fever.  HENT:  Positive for congestion, postnasal drip, rhinorrhea, sinus pressure, sinus pain and sore throat.   Respiratory:  Positive for cough. Negative for shortness of breath.   Cardiovascular:  Negative for chest pain and leg swelling.  Gastrointestinal:  Negative for abdominal pain.  Neurological:  Negative for dizziness and headaches.       Objective    BP 112/82 (BP Location: Left Arm, Patient Position: Sitting, Cuff Size: Normal)   Pulse 93   Temp 97.6 F (36.4 C) (Oral)   Resp 16   Ht 5' (1.524 m)   Wt 129 lb 12.8 oz (58.9 kg)   LMP 01/08/2017   SpO2 95%   BMI 25.35 kg/m    Physical Exam Constitutional:      General: She is awake.     Appearance: She is well-developed.  HENT:      Head: Normocephalic.     Right Ear: Tympanic membrane normal.     Left Ear: There is impacted cerumen.  Eyes:     Conjunctiva/sclera: Conjunctivae normal.  Cardiovascular:     Rate and Rhythm: Normal rate and regular rhythm.     Heart sounds: Normal heart sounds.  Pulmonary:     Effort: Pulmonary effort is normal.     Breath sounds: Normal breath sounds.  Skin:    General: Skin is warm.  Neurological:     Mental Status: She is alert and oriented to person, place, and time.  Psychiatric:        Attention and Perception: Attention normal.        Mood and Affect: Mood normal.        Speech: Speech normal.        Behavior: Behavior is cooperative.     No results found for any visits on 03/12/24.  Assessment & Plan    Acute non-recurrent maxillary sinusitis -     Azithromycin; Take 2 tablets on day 1, then 1 tablet daily on days 2 through 5  Dispense: 6 tablet; Refill: 0 -     predniSONE; Take 1 tablet (20 mg total) by mouth daily with breakfast.  Dispense: 5 tablet; Refill: 0 -     Azelastine  HCl; Place 1 spray into both nostrils 2 (two) times daily. Use in each nostril as directed  Dispense: 30 mL; Refill: 0   Cont antihistamines, nasal sprays, rx additional azelastine, rx prednisone x 5 days and zpack.    Return if symptoms worsen or fail to improve.       Alfredia Ferguson, PA-C  [facility] at [facility] [phone] (phone) [phone] (fax)  [facility]

## 2024-04-03 ENCOUNTER — Other Ambulatory Visit: Payer: Self-pay | Admitting: Physician Assistant

## 2024-04-03 DIAGNOSIS — J0100 Acute maxillary sinusitis, unspecified: Secondary | ICD-10-CM

## 2024-04-30 ENCOUNTER — Encounter: Payer: Self-pay | Admitting: Pulmonary Disease

## 2024-04-30 ENCOUNTER — Ambulatory Visit: Admitting: Pulmonary Disease

## 2024-04-30 ENCOUNTER — Ambulatory Visit (INDEPENDENT_AMBULATORY_CARE_PROVIDER_SITE_OTHER): Admitting: Physician Assistant

## 2024-04-30 VITALS — BP 119/68 | HR 101 | Temp 98.1°F | Resp 20 | Ht 60.0 in | Wt 128.0 lb

## 2024-04-30 VITALS — BP 100/80 | HR 94 | Ht 60.0 in | Wt 128.2 lb

## 2024-04-30 DIAGNOSIS — R0609 Other forms of dyspnea: Secondary | ICD-10-CM

## 2024-04-30 DIAGNOSIS — F17210 Nicotine dependence, cigarettes, uncomplicated: Secondary | ICD-10-CM

## 2024-04-30 DIAGNOSIS — Z720 Tobacco use: Secondary | ICD-10-CM | POA: Diagnosis not present

## 2024-04-30 DIAGNOSIS — J4541 Moderate persistent asthma with (acute) exacerbation: Secondary | ICD-10-CM

## 2024-04-30 MED ORDER — PREDNISONE 20 MG PO TABS: 20.0000 mg | ORAL_TABLET | Freq: Every day | ORAL | 0 refills | Status: DC

## 2024-04-30 MED ORDER — AZITHROMYCIN 250 MG PO TABS: ORAL_TABLET | ORAL | 0 refills | Status: AC

## 2024-04-30 NOTE — Progress Notes (Unsigned)
 Established patient visit   Patient: Monica Dillon   DOB: May 16, 1966   58 y.o. Female  MRN: [id number] Visit Date: 04/30/2024  Today's healthcare provider: Trenton Frock, PA-C   Chief Complaint  Patient presents with   Cough   Subjective    Pt reports cough, congestion, sinus pressure, wheezing, some SOB for the last 5 days. She reports a negative covid test yesterday. Taking Zyrtec --D otc.   Medications: Outpatient Medications Prior to Visit  Medication Sig   acetaminophen (TYLENOL) 325 MG tablet Take 650 mg by mouth every 6 (six) hours as needed for headache.   albuterol  (VENTOLIN  HFA) 108 (90 Base) MCG/ACT inhaler TAKE 2 PUFFS BY MOUTH EVERY 6 HOURS AS NEEDED FOR WHEEZE OR SHORTNESS OF BREATH   Azelastine  HCl 137 MCG/SPRAY SOLN PLACE 1 SPRAY INTO BOTH NOSTRILS 2 (TWO) TIMES DAILY. USE IN EACH NOSTRIL AS DIRECTED   cetirizine  (ZYRTEC ) 10 MG tablet Take 1 tablet (10 mg total) by mouth 2 (two) times daily as needed for allergies.   [DISCONTINUED] cyclobenzaprine  (FLEXERIL ) 5 MG tablet Take 1 tablet (5 mg total) by mouth 3 (three) times daily as needed for muscle spasms.   [DISCONTINUED] predniSONE  (DELTASONE ) 20 MG tablet Take 1 tablet (20 mg total) by mouth daily with breakfast.   No facility-administered medications prior to visit.    Review of Systems  Constitutional:  Positive for fatigue. Negative for fever.  HENT:  Positive for congestion, sinus pressure, sinus pain and sore throat.   Respiratory:  Positive for cough and wheezing. Negative for shortness of breath.   Cardiovascular:  Negative for chest pain and leg swelling.  Gastrointestinal:  Negative for abdominal pain.  Neurological:  Negative for dizziness and headaches.       Objective    BP 119/68 (BP Location: Right Arm, Patient Position: Sitting, Cuff Size: Normal)   Pulse (!) 101   Temp 98.1 F (36.7 C) (Oral)   Resp 20   Ht 5' (1.524 m)   Wt 128 lb (58.1 kg)   LMP 01/08/2017   SpO2 94%   BMI  25.00 kg/m    Physical Exam Constitutional:      General: She is awake.     Appearance: She is well-developed.  HENT:     Head: Normocephalic.     Right Ear: Tympanic membrane normal.     Left Ear: Tympanic membrane normal.  Eyes:     Conjunctiva/sclera: Conjunctivae normal.  Cardiovascular:     Rate and Rhythm: Normal rate and regular rhythm.     Heart sounds: Normal heart sounds.  Pulmonary:     Effort: Pulmonary effort is normal.     Breath sounds: Examination of the right-lower field reveals rhonchi. Examination of the left-lower field reveals rhonchi. Rhonchi present.  Skin:    General: Skin is warm.  Neurological:     Mental Status: She is alert and oriented to person, place, and time.  Psychiatric:        Attention and Perception: Attention normal.        Mood and Affect: Mood normal.        Speech: Speech normal.        Behavior: Behavior is cooperative.      No results found for any visits on 04/30/24.  Assessment & Plan    Moderate persistent asthma with exacerbation Pt has been considered asthmatic, but tobacco use history and no historical PFTs that I can review. Cont albuterol  inhaler, rx prednisone  20  mg x 5 days, zpack.  Referring to pulm.  -     predniSONE ; Take 1 tablet (20 mg total) by mouth daily with breakfast.  Dispense: 5 tablet; Refill: 0 -     Azithromycin ; Take 2 tablets on day 1, then 1 tablet daily on days 2 through 5  Dispense: 6 tablet; Refill: 0 -     Pulmonary Visit  Tobacco use Recommend cessation -     Pulmonary Visit  Return if symptoms worsen or fail to improve.       Trenton Frock, PA-C  [facility] at [facility] [phone] (phone) [phone] (fax)  [facility]

## 2024-04-30 NOTE — Patient Instructions (Signed)
 It is nice to meet you  Use Trelegy 1 puff once a day every day.  Rinse your mouth out with water after use.  Lets get pulmonary function test to see if COPD is present or not.  We discussed slowly decreasing amount of cigarettes you smoke every day, let see if this helps.  It may not.  Would recommend you call the smoking cessation phone line to see about getting free nicotine replacement.  Return to clinic in 3 months with pulmonary function test same day, follow-up visit with Dr. Marygrace Snellen after

## 2024-05-01 ENCOUNTER — Encounter: Payer: Self-pay | Admitting: Physician Assistant

## 2024-05-01 MED ORDER — TRELEGY ELLIPTA 200-62.5-25 MCG/ACT IN AEPB: 1.0000 | INHALATION_SPRAY | Freq: Every day | RESPIRATORY_TRACT | 11 refills | Status: AC

## 2024-05-01 MED ORDER — PREDNISONE 20 MG PO TABS: ORAL_TABLET | ORAL | 0 refills | Status: AC
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
## 2024-05-27 ENCOUNTER — Ambulatory Visit (INDEPENDENT_AMBULATORY_CARE_PROVIDER_SITE_OTHER): Payer: BC Managed Care – PPO | Admitting: Family

## 2024-05-27 VITALS — BP 121/68 | HR 85 | Temp 98.0°F | Resp 16 | Ht 60.0 in | Wt 128.0 lb

## 2024-05-27 DIAGNOSIS — J45909 Unspecified asthma, uncomplicated: Secondary | ICD-10-CM | POA: Insufficient documentation

## 2024-05-27 DIAGNOSIS — Z1211 Encounter for screening for malignant neoplasm of colon: Secondary | ICD-10-CM

## 2024-05-27 DIAGNOSIS — E785 Hyperlipidemia, unspecified: Secondary | ICD-10-CM

## 2024-05-27 DIAGNOSIS — Z1231 Encounter for screening mammogram for malignant neoplasm of breast: Secondary | ICD-10-CM

## 2024-05-27 DIAGNOSIS — Z720 Tobacco use: Secondary | ICD-10-CM | POA: Diagnosis not present

## 2024-05-27 DIAGNOSIS — Z0000 Encounter for general adult medical examination without abnormal findings: Secondary | ICD-10-CM

## 2024-05-27 DIAGNOSIS — R739 Hyperglycemia, unspecified: Secondary | ICD-10-CM

## 2024-05-27 LAB — COMPREHENSIVE METABOLIC PANEL WITH GFR
ALT: 17 U/L (ref 0–35)
AST: 15 U/L (ref 0–37)
Albumin: 4.3 g/dL (ref 3.5–5.2)
Alkaline Phosphatase: 51 U/L (ref 39–117)
BUN: 14 mg/dL (ref 6–23)
CO2: 31 meq/L (ref 19–32)
Calcium: 9.8 mg/dL (ref 8.4–10.5)
Chloride: 102 meq/L (ref 96–112)
Creatinine, Ser: 0.66 mg/dL (ref 0.40–1.20)
GFR: 97.15 mL/min (ref >60.00)
Glucose, Bld: 113 mg/dL — ABNORMAL HIGH (ref 70–99)
Potassium: 4.8 meq/L (ref 3.5–5.1)
Sodium: 139 meq/L (ref 135–145)
Total Bilirubin: 0.5 mg/dL (ref 0.2–1.2)
Total Protein: 6.9 g/dL (ref 6.0–8.3)

## 2024-05-27 LAB — HEMOGLOBIN A1C: Hgb A1c MFr Bld: 6.3 % (ref 4.6–6.5)

## 2024-05-27 LAB — LIPID PANEL
Cholesterol: 232 mg/dL — ABNORMAL HIGH (ref 0–200)
HDL: 36.4 mg/dL — ABNORMAL LOW (ref >39.00)
LDL Cholesterol: 169 mg/dL — ABNORMAL HIGH (ref 0–99)
NonHDL: 195.97
Total CHOL/HDL Ratio: 6
Triglycerides: 137 mg/dL (ref 0.0–149.0)
VLDL: 27.4 mg/dL (ref 0.0–40.0)

## 2024-05-27 NOTE — Assessment & Plan Note (Signed)
  Significant muscle weakness with Lipitor. Crestor considered if cholesterol levels are high. - Order cholesterol panel. - Consider starting Crestor if cholesterol levels are high and she can tolerate it. - Repeat cholesterol test in six weeks if Crestor is started.
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
## 2024-05-27 NOTE — Assessment & Plan Note (Signed)
  Up to date on tetanus and shingles vaccinations. Prefers Cologuard for colorectal cancer screening. Overdue for mammogram. Candidate for CT lung cancer screening due to smoking history. - Encourage flu shot and COVID booster in the fall. - Reorder Cologuard kit; follow up if not received in two weeks. - Order mammogram; she to schedule downstairs. - Order CT lung cancer screening study. - Encourage continuation of healthy diet and regular exercise.

## 2024-05-27 NOTE — Patient Instructions (Signed)
 VISIT SUMMARY:  You had a follow-up visit to discuss your recent asthma flare-up and other health concerns. We reviewed your symptoms, smoking reduction progress, and overall health maintenance.  YOUR PLAN:  ASTHMA VS. EARLY COPD: There is some uncertainty between asthma and early COPD. You experienced increased coughing and throat irritation after stopping prednisone . -Continue taking Trelegy as prescribed. -Gargle with warm water after each dose of Trelegy. -You are scheduled for a COPD test in early August. -Continue reducing your cigarette intake by two every two weeks.  HYPERLIPIDEMIA: You had significant muscle weakness with Lipitor. We will consider Crestor if your cholesterol levels are high. -A cholesterol panel has been ordered. -If your cholesterol levels are high and you can tolerate it, we may start you on Crestor. -Repeat cholesterol test in six weeks if Crestor is started.  GENERAL HEALTH MAINTENANCE: We discussed your vaccinations, cancer screenings, and overall health maintenance. -You are up to date on tetanus and shingles vaccinations. -You prefer Cologuard for colorectal cancer screening. We will reorder the kit and follow up if you do not receive it in two weeks. -You are overdue for a mammogram. We have ordered it, and you should schedule it downstairs. -You are a candidate for a CT lung cancer screening study due to your smoking history. We have ordered this test. -Encourage you to get a flu shot and COVID booster in the fall. -Continue your healthy diet and regular exercise.  FOLLOW-UP: We will follow up on your cholesterol levels and potential initiation of Crestor. -Follow up in one year unless issues arise. -We will notify you of your cholesterol results and discuss the plan for Crestor if indicated.

## 2024-05-27 NOTE — Assessment & Plan Note (Signed)
 Encouraged cessation.

## 2024-05-27 NOTE — Assessment & Plan Note (Signed)
 Uncertainty between asthma and early COPD. Increased coughing and throat irritation after stopping prednisone . Smoking cessation in progress. - Continue Trelegy as prescribed. - Gargle with warm water after each dose of Trelegy. - Scheduled for PFT's per pulmonary for early August.

## 2024-05-28 ENCOUNTER — Telehealth (HOSPITAL_BASED_OUTPATIENT_CLINIC_OR_DEPARTMENT_OTHER): Payer: Self-pay

## 2024-06-02 ENCOUNTER — Ambulatory Visit: Payer: Self-pay | Admitting: Family

## 2024-06-02 DIAGNOSIS — Z1211 Encounter for screening for malignant neoplasm of colon: Secondary | ICD-10-CM | POA: Diagnosis not present

## 2024-06-06 LAB — COLOGUARD: COLOGUARD: NEGATIVE

## 2024-06-23 ENCOUNTER — Telehealth (HOSPITAL_BASED_OUTPATIENT_CLINIC_OR_DEPARTMENT_OTHER): Payer: Self-pay

## 2024-08-07 ENCOUNTER — Encounter

## 2024-08-07 ENCOUNTER — Ambulatory Visit: Admitting: Pulmonary Disease

## 2024-08-20 ENCOUNTER — Other Ambulatory Visit: Payer: Self-pay | Admitting: Family Medicine

## 2024-08-20 DIAGNOSIS — J0100 Acute maxillary sinusitis, unspecified: Secondary | ICD-10-CM

## 2024-09-30 ENCOUNTER — Ambulatory Visit: Payer: Self-pay

## 2024-09-30 ENCOUNTER — Ambulatory Visit (INDEPENDENT_AMBULATORY_CARE_PROVIDER_SITE_OTHER): Admitting: Family Medicine

## 2024-09-30 VITALS — BP 122/92 | HR 80 | Temp 97.8°F | Resp 18 | Ht 60.0 in | Wt 124.4 lb

## 2024-09-30 DIAGNOSIS — K529 Noninfective gastroenteritis and colitis, unspecified: Secondary | ICD-10-CM

## 2024-09-30 LAB — POC URINALSYSI DIPSTICK (AUTOMATED)
Bilirubin, UA: NEGATIVE
Blood, UA: NEGATIVE
Glucose, UA: NEGATIVE
Ketones, UA: NEGATIVE
Leukocytes, UA: NEGATIVE
Nitrite, UA: NEGATIVE
Protein, UA: NEGATIVE
Spec Grav, UA: <=1.005 — AB (ref 1.010–1.025)
Urobilinogen, UA: 0.2 U/dL
pH, UA: 6.5 (ref 5.0–8.0)

## 2024-09-30 MED ORDER — ONDANSETRON HCL 4 MG PO TABS: ORAL_TABLET | ORAL | 0 refills | Status: DC

## 2024-09-30 MED ORDER — DICYCLOMINE HCL 10 MG PO CAPS: 10.0000 mg | ORAL_CAPSULE | Freq: Three times a day (TID) | ORAL | 0 refills | Status: DC

## 2024-09-30 NOTE — Patient Instructions (Signed)
 Food Choices to Help Relieve Diarrhea, Adult Diarrhea can make you feel weak and cause you to become dehydrated. Dehydration is a condition in which there is not enough water or other fluids in the body. It is important to choose the right foods and drinks to: Relieve diarrhea. Replace lost fluids and nutrients. Prevent dehydration. What are tips for following this plan? Relieving diarrhea Avoid foods that make your diarrhea worse. These may include: Foods and drinks that are sweetened with high-fructose corn syrup, honey, or sweeteners such as xylitol, sorbitol, and mannitol. Check food labels for these ingredients. Fried, greasy, or spicy foods. Raw fruits and vegetables. Eat foods that are rich in probiotics. These include foods such as yogurt and fermented milk products. Probiotics can help increase healthy bacteria in your stomach and intestines (gastrointestinal or GI tract). This may help digestion and stop diarrhea. If you have lactose intolerance, avoid dairy products. These may make your diarrhea worse. Take medicine to help stop diarrhea only as told by your health care provider. Replacing nutrients  Eat bland, easy-to-digest foods in small amounts as you are able, until your diarrhea starts to get better. These foods include bananas, applesauce, rice, toast, and crackers. Over time, add nutrient-rich foods as your body tolerates them or as told by your health care provider. These include: Well-cooked protein foods, such as eggs, lean meats like fish or chicken without skin, and tofu. Peeled, seeded, and soft-cooked fruits and vegetables. Low-fat dairy products. Whole grains. Take vitamin and mineral supplements as told by your health care provider. Preventing dehydration  Start by sipping water or a solution to prevent dehydration (oral rehydration solution, or ORS). This is a drink that helps replace fluids and minerals your body has lost. You can buy an ORS at pharmacies and  retail stores. Try to drink at least 8-10 cups (2,000-2,500 mL) of fluid each day to help replace lost fluids. If your urine is pale yellow, you are getting enough fluids. You may drink other liquids in addition to water, such as fruit juice that you have added water to (diluted fruit juice) or low-calorie sports drinks, as tolerated or as told by your health care provider. Avoid drinks with caffeine, such as coffee, tea, or soft drinks. Avoid alcohol. This information is not intended to replace advice given to you by your health care provider. Make sure you discuss any questions you have with your health care provider. Document Revised: 05/23/2022 Document Reviewed: 05/23/2022 Elsevier Patient Education  2024 [employer].

## 2024-09-30 NOTE — Telephone Encounter (Signed)
  FYI Only or Action Required?: Action required by provider: request for appointment.  Patient was last seen in primary care on 05/27/2024 by Daryl Setter, NP.  Called Nurse Triage reporting Diarrhea.  Symptoms began several days ago.  Interventions attempted: Rest, hydration, or home remedies.  Symptoms are: unchanged.Started feeling bad Sunday. Diarrhea and vomiting.  Triage Disposition: See Physician Within 24 Hours  Patient/caregiver understands and will follow disposition?: Yes     Copied from CRM #[id number]. Topic: Clinical - Red Word Triage >> Sep 30, 2024  8:48 AM Zy'onna H wrote: Red Word that prompted transfer to Nurse Triage: Patient is having the following symptoms - Diarrhea - Vomiting  (Since Sunday)  - Headache - Sweating through the night last night   **Transf. to NT** Reason for Disposition  [1] SEVERE diarrhea (e.g., 7 or more times / day more than normal) AND [2] present > 24 hours (1 day)  Answer Assessment - Initial Assessment Questions 1. DIARRHEA SEVERITY: How bad is the diarrhea? How many more stools have you had in the past 24 hours than normal?      More than 7 2. ONSET: When did the diarrhea begin?      Sunday 3. STOOL DESCRIPTION:  How loose or watery is the diarrhea? What is the stool color? Is there any blood or mucous in the stool?     loose 4. VOMITING: Are you also vomiting? If Yes, ask: How many times in the past 24 hours?      2-3 5. ABDOMEN PAIN: Are you having any abdomen pain? If Yes, ask: What does it feel like? (e.g., crampy, dull, intermittent, constant)      cramping 6. ABDOMEN PAIN SEVERITY: If present, ask: How bad is the pain?  (e.g., Scale 1-10; mild, moderate, or severe)     moderate 7. ORAL INTAKE: If vomiting, Have you been able to drink liquids? How much liquids have you had in the past 24 hours?     yes 8. HYDRATION: Any signs of dehydration? (e.g., dry mouth [not just dry lips], too weak  to stand, dizziness, new weight loss) When did you last urinate?     no 9. EXPOSURE: Have you traveled to a foreign country recently? Have you been exposed to anyone with diarrhea? Could you have eaten any food that was spoiled?     no 10. ANTIBIOTIC USE: Are you taking antibiotics now or have you taken antibiotics in the past 2 months?       no 11. OTHER SYMPTOMS: Do you have any other symptoms? (e.g., fever, blood in stool)       no 12. PREGNANCY: Is there any chance you are pregnant? When was your last menstrual period?       no  Protocols used: [facility]
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
## 2024-10-01 ENCOUNTER — Telehealth: Payer: Self-pay | Admitting: Family Medicine

## 2024-10-01 ENCOUNTER — Encounter: Payer: Self-pay | Admitting: Family Medicine

## 2024-10-01 LAB — CBC WITH DIFFERENTIAL/PLATELET
Basophils Absolute: 0.1 K/uL (ref 0.0–0.1)
Basophils Relative: 1.2 % (ref 0.0–3.0)
Eosinophils Absolute: 0.2 K/uL (ref 0.0–0.7)
Eosinophils Relative: 1.6 % (ref 0.0–5.0)
HCT: 44.7 % (ref 36.0–46.0)
Hemoglobin: 14.8 g/dL (ref 12.0–15.0)
Lymphocytes Relative: 24.8 % (ref 12.0–46.0)
Lymphs Abs: 2.8 K/uL (ref 0.7–4.0)
MCHC: 33 g/dL (ref 30.0–36.0)
MCV: 93.4 fl (ref 78.0–100.0)
Monocytes Absolute: 0.6 K/uL (ref 0.1–1.0)
Monocytes Relative: 5.7 % (ref 3.0–12.0)
Neutro Abs: 7.6 K/uL (ref 1.4–7.7)
Neutrophils Relative %: 66.7 % (ref 43.0–77.0)
Platelets: 316 K/uL (ref 150.0–400.0)
RBC: 4.79 Mil/uL (ref 3.87–5.11)
RDW: 13.4 % (ref 11.5–15.5)
WBC: 11.4 K/uL — ABNORMAL HIGH (ref 4.0–10.5)

## 2024-10-01 LAB — COMPREHENSIVE METABOLIC PANEL WITH GFR
ALT: 12 U/L (ref 0–35)
AST: 14 U/L (ref 0–37)
Albumin: 4.5 g/dL (ref 3.5–5.2)
Alkaline Phosphatase: 54 U/L (ref 39–117)
BUN: 8 mg/dL (ref 6–23)
CO2: 28 meq/L (ref 19–32)
Calcium: 9.2 mg/dL (ref 8.4–10.5)
Chloride: 101 meq/L (ref 96–112)
Creatinine, Ser: 0.61 mg/dL (ref 0.40–1.20)
GFR: 98.77 mL/min (ref >60.00)
Glucose, Bld: 102 mg/dL — ABNORMAL HIGH (ref 70–99)
Potassium: 3.8 meq/L (ref 3.5–5.1)
Sodium: 141 meq/L (ref 135–145)
Total Bilirubin: 0.3 mg/dL (ref 0.2–1.2)
Total Protein: 7 g/dL (ref 6.0–8.3)

## 2024-10-01 NOTE — Telephone Encounter (Signed)
 Pt came in needing paper work filled out by [employer]. Pt stated she only need the top page to be filled out to be picked up and the rest of the papers can be faxed. Placed papers in pcps box.

## 2024-10-02 ENCOUNTER — Ambulatory Visit: Payer: Self-pay | Admitting: Family

## 2024-10-02 ENCOUNTER — Telehealth: Payer: Self-pay

## 2024-10-02 NOTE — Telephone Encounter (Signed)
 Pt came in again regarding paper work to be filled out so she can return to work. Pt also stated the visit she was seen for with Dr. Antonio was the reason for her being out of work. Please advise and call pt when papers are ready.

## 2024-10-02 NOTE — Telephone Encounter (Signed)
 Copied from CRM #[id number]. Topic: General - Other >> Oct 02, 2024  9:49 AM Nessti S wrote: Reason for CRM: patient called because she needs work release form completed today because she was supposed to return back to work today. Call back number [phone]

## 2024-10-02 NOTE — Telephone Encounter (Signed)
 Also placed another print out the pt left in Dr. Flynn box.

## 2024-10-03 NOTE — Telephone Encounter (Signed)
 Form completed. Pt requested for form be faxed and placed at the front for pick up. Form faxed n placed at the front

## 2024-10-06 NOTE — Telephone Encounter (Signed)
 Copied from CRM #[id number]. Topic: General - Other >> Oct 06, 2024  4:06 PM Monica Dillon wrote: Patient calling in concerning FMLA paperwork, patient got notice that it is incomplete, Question 1 needs to be filled out ,with the date seen, dates out of work, the reason of being out of work. And if there is any changes please date it and initial. And needs it returned ASAP.   Call back number [phone] Patient requests a return call concerning this

## 2024-10-08 ENCOUNTER — Telehealth: Payer: Self-pay | Admitting: Neurology

## 2024-10-08 NOTE — Telephone Encounter (Signed)
 Copied from CRM [id number]. Topic: General - Other >> Oct 08, 2024 10:29 AM Monica Dillon wrote: Reason for CRM: Patient needs her FMLA paperwork fixed, it needs to indicate treatment date, when she was seen and was out, if any over the counter medicine was given, and if she was referred to another doctor or not please fax to [phone]

## 2024-10-08 NOTE — Telephone Encounter (Signed)
 Pt saw Dr. Antonio.

## 2024-10-13 NOTE — Telephone Encounter (Unsigned)
 Copied from CRM [id number]. Topic: General - Other >> Oct 13, 2024  8:56 AM Rea ORN wrote: Reason for CRM: Pt called to follow up on FMLA paperwork. After speaking with CAL, pt was advised that the paperwork is pending being corrected.  Pt stated that this needs to be turned into [employer] by 10/29 or her claim is denied. Pt is asking for a call back at [phone] to advise status of this request.

## 2024-10-15 NOTE — Telephone Encounter (Signed)
 Pt stated if fax did not come through today then it will come tomorrow.  Please be on the look out.

## 2024-10-15 NOTE — Telephone Encounter (Signed)
 Spoke with patient and advised her that I do not have a copy of paperwork and to have them resend copy to us  and to ask for an extension since Dr. Antonio is out of the office.  She will have them fax to us  again.

## 2024-10-16 NOTE — Telephone Encounter (Signed)
 Noted

## 2024-10-17 NOTE — Telephone Encounter (Unsigned)
 Copied from CRM [id number]. Topic: General - Other >> Oct 17, 2024 10:03 AM Revonda D wrote: Reason for CRM:  Pt called to follow up on FMLA paperwork. Pt wants to know if it was received and signed. Pt would like a callback today with an update.

## 2024-10-17 NOTE — Telephone Encounter (Signed)
Not received yet.

## 2024-10-21 ENCOUNTER — Other Ambulatory Visit: Payer: Self-pay | Admitting: Family Medicine

## 2024-10-21 ENCOUNTER — Telehealth: Payer: Self-pay | Admitting: Family Medicine

## 2024-10-21 DIAGNOSIS — K529 Noninfective gastroenteritis and colitis, unspecified: Secondary | ICD-10-CM

## 2024-10-21 NOTE — Telephone Encounter (Signed)
 Called pt and lvm for pt to come pick up forms today.

## 2024-10-21 NOTE — Telephone Encounter (Signed)
 Form completed and faxed. Placed at the front for pickup.

## 2024-10-21 NOTE — Telephone Encounter (Signed)
 Pt came in to drop off another form of paper work to be filled out by Dr. Antonio. I gave paper work to [employer]. Pt needs paper work completed by 10/22/24 if possible or pt will lose her job. Please call pt when papers have been faxed and are ready to be picked up.

## 2024-12-23 ENCOUNTER — Emergency Department (HOSPITAL_BASED_OUTPATIENT_CLINIC_OR_DEPARTMENT_OTHER)

## 2024-12-23 ENCOUNTER — Other Ambulatory Visit: Payer: Self-pay

## 2024-12-23 ENCOUNTER — Encounter (HOSPITAL_BASED_OUTPATIENT_CLINIC_OR_DEPARTMENT_OTHER): Payer: Self-pay

## 2024-12-23 ENCOUNTER — Emergency Department (HOSPITAL_BASED_OUTPATIENT_CLINIC_OR_DEPARTMENT_OTHER)
Admission: EM | Admit: 2024-12-23 | Discharge: 2024-12-23 | Disposition: A | Discharge status: Home / Self Care | Source: Ambulatory Visit | Attending: Emergency Medicine | Admitting: Emergency Medicine

## 2024-12-23 ENCOUNTER — Ambulatory Visit: Payer: Self-pay

## 2024-12-23 DIAGNOSIS — K5000 Crohn's disease of small intestine without complications: Secondary | ICD-10-CM | POA: Insufficient documentation

## 2024-12-23 DIAGNOSIS — K8500 Idiopathic acute pancreatitis without necrosis or infection: Secondary | ICD-10-CM | POA: Insufficient documentation

## 2024-12-23 DIAGNOSIS — K8689 Other specified diseases of pancreas: Secondary | ICD-10-CM

## 2024-12-23 DIAGNOSIS — R1011 Right upper quadrant pain: Secondary | ICD-10-CM | POA: Diagnosis present

## 2024-12-23 HISTORY — DX: Other specified diseases of pancreas: K86.89

## 2024-12-23 LAB — COMPREHENSIVE METABOLIC PANEL WITH GFR
ALT: 11 U/L (ref 0–44)
AST: 17 U/L (ref 15–41)
Albumin: 4.6 g/dL (ref 3.5–5.0)
Alkaline Phosphatase: 68 U/L (ref 38–126)
Anion gap: 16 — ABNORMAL HIGH (ref 5–15)
BUN: 7 mg/dL (ref 6–20)
CO2: 24 mmol/L (ref 22–32)
Calcium: 9.9 mg/dL (ref 8.9–10.3)
Chloride: 98 mmol/L (ref 98–111)
Creatinine, Ser: 0.76 mg/dL (ref 0.44–1.00)
GFR, Estimated: >60 mL/min
Glucose, Bld: 77 mg/dL (ref 70–99)
Potassium: 3.5 mmol/L (ref 3.5–5.1)
Sodium: 138 mmol/L (ref 135–145)
Total Bilirubin: 0.4 mg/dL (ref 0.0–1.2)
Total Protein: 8.1 g/dL (ref 6.5–8.1)

## 2024-12-23 LAB — CBC
HCT: 42.7 % (ref 36.0–46.0)
Hemoglobin: 15.1 g/dL — ABNORMAL HIGH (ref 12.0–15.0)
MCH: 31.1 pg (ref 26.0–34.0)
MCHC: 35.4 g/dL (ref 30.0–36.0)
MCV: 88 fL (ref 80.0–100.0)
Platelets: 385 K/uL (ref 150–400)
RBC: 4.85 MIL/uL (ref 3.87–5.11)
RDW: 11.6 % (ref 11.5–15.5)
WBC: 11.6 K/uL — ABNORMAL HIGH (ref 4.0–10.5)
nRBC: 0 % (ref 0.0–0.2)

## 2024-12-23 LAB — LIPASE, BLOOD: Lipase: 174 U/L — ABNORMAL HIGH (ref 11–51)

## 2024-12-23 LAB — URINALYSIS, MICROSCOPIC (REFLEX)
Bacteria, UA: NONE SEEN
RBC / HPF: NONE SEEN RBC/hpf (ref 0–5)
WBC, UA: NONE SEEN WBC/hpf (ref 0–5)

## 2024-12-23 LAB — URINALYSIS, ROUTINE W REFLEX MICROSCOPIC
Bilirubin Urine: NEGATIVE
Glucose, UA: NEGATIVE mg/dL
Ketones, ur: NEGATIVE mg/dL
Leukocytes,Ua: NEGATIVE
Nitrite: NEGATIVE
Protein, ur: NEGATIVE mg/dL
Specific Gravity, Urine: 1.01 (ref 1.005–1.030)
pH: 5.5 (ref 5.0–8.0)

## 2024-12-23 MED ORDER — IOHEXOL 300 MG/ML  SOLN
100.0000 mL | Freq: Once | INTRAMUSCULAR | Status: AC | PRN
  Administered 2024-12-23: 100 mL via INTRAVENOUS

## 2024-12-23 MED ORDER — OXYCODONE-ACETAMINOPHEN 5-325 MG PO TABS: 1.0000 | ORAL_TABLET | Freq: Four times a day (QID) | ORAL | 0 refills | Status: DC | PRN

## 2024-12-23 MED ORDER — ONDANSETRON HCL 4 MG PO TABS: 4.0000 mg | ORAL_TABLET | Freq: Four times a day (QID) | ORAL | 0 refills | Status: DC

## 2024-12-23 NOTE — ED Triage Notes (Signed)
 Lower back for 1.5 weeks. Epigastric abd cramping, bloating and decreased appetite for 5 days.   Denies NVD, urinary symptoms

## 2024-12-23 NOTE — ED Notes (Signed)
 Lab notified of triglycerides add-on.

## 2024-12-23 NOTE — ED Provider Notes (Signed)
 " [place] [facility] Provider Note   CSN: [id number] Arrival date & time: 12/23/24  9079     Patient presents with: Abdominal Pain and Back Pain   Monica Dillon is a 59 y.o. female who presents to the ED today with primary concern of right upper quadrant discomfort.  States it started approximately 10 days ago as right sided mid back pain but has steadily progressed over the last 3 to 5 days to pain that extends over into the right upper quadrant of the abdomen.  She also complains of increased sensation of fullness and bloating of the abdomen as well as decreased appetite and increased sensation of satiety.  She states that she has had bowel movements as recent as yesterday evening however states that they are small in volume and hard to pass.  Normally has not had any issues with bowel movements, however recently has had decreasing frequency of stool, denies any previous abdominal surgeries.  Pain is not worsened with oral intake however she states that usually for breakfast she would have a yogurt and some granola however has only been able to have half a cup of yogurt due to increased satiety.  No new medications, does not take any medications that would produce increase satiety.  To ED by primary care after she had contacted them by phone out of concern for increasing back pain and abdominal pain.    Abdominal Pain Associated symptoms: constipation   Back Pain Associated symptoms: abdominal pain        Prior to Admission medications  Medication Sig Start Date End Date Taking? Authorizing Provider  ondansetron  (ZOFRAN ) 4 MG tablet Take 1 tablet (4 mg total) by mouth every 6 (six) hours. 12/23/24  Yes Myriam Dorn BROCKS, PA  oxyCODONE -acetaminophen  (PERCOCET/ROXICET) 5-325 MG tablet Take 1 tablet by mouth every 6 (six) hours as needed for severe pain (pain score 7-10). 12/23/24  Yes Myriam Dorn BROCKS, PA  acetaminophen  (TYLENOL ) 325 MG tablet Take 650 mg  by mouth every 6 (six) hours as needed for headache.    [provider]  albuterol  (VENTOLIN  HFA) 108 (90 Base) MCG/ACT inhaler TAKE 2 PUFFS BY MOUTH EVERY 6 HOURS AS NEEDED FOR WHEEZE OR SHORTNESS OF BREATH 07/09/23   Domenica Harlene LABOR, MD  Azelastine  HCl 137 MCG/SPRAY SOLN PLACE 1 SPRAY INTO BOTH NOSTRILS 2 (TWO) TIMES DAILY. USE IN EACH NOSTRIL AS DIRECTED 08/20/24   Domenica Harlene LABOR, MD  cetirizine  (ZYRTEC ) 10 MG tablet Take 1 tablet (10 mg total) by mouth 2 (two) times daily as needed for allergies. 04/29/23   Fleming, Zelda W, NP  dicyclomine  (BENTYL ) 10 MG capsule Take 1 capsule (10 mg total) by mouth 4 (four) times daily -  before meals and at bedtime. 09/30/24   Lowne Chase, Yvonne R, DO  Fluticasone-Umeclidin-Vilant (TRELEGY ELLIPTA ) 200-62.5-25 MCG/ACT AEPB Inhale 1 puff into the lungs daily. 05/01/24   Hunsucker, Donnice SAUNDERS, MD    Allergies: Lipitor [atorvastatin ]    Review of Systems  Gastrointestinal:  Positive for abdominal distention, abdominal pain and constipation.  Musculoskeletal:  Positive for back pain.  All other systems reviewed and are negative.   Updated Vital Signs BP (!) 148/69 (BP Location: Left Arm)   Pulse 79   Temp 98.8 F (37.1 C) (Oral)   Resp 16   LMP 01/08/2017   SpO2 96%   Physical Exam Vitals and nursing note reviewed.  Constitutional:      General: She is not  in acute distress.    Appearance: Normal appearance. She is well-developed and normal weight.  HENT:     Head: Normocephalic and atraumatic.     Mouth/Throat:     Mouth: Mucous membranes are moist.     Pharynx: Oropharynx is clear.  Eyes:     General: No scleral icterus.    Extraocular Movements: Extraocular movements intact.     Conjunctiva/sclera: Conjunctivae normal.     Pupils: Pupils are equal, round, and reactive to light.  Cardiovascular:     Rate and Rhythm: Normal rate and regular rhythm.     Pulses: Normal pulses.     Heart sounds: Normal heart sounds. No murmur heard.     No friction rub. No gallop.  Pulmonary:     Effort: Pulmonary effort is normal.     Breath sounds: Normal breath sounds.  Abdominal:     General: Abdomen is flat. Bowel sounds are normal.     Palpations: Abdomen is soft.     Tenderness: There is generalized abdominal tenderness and tenderness in the right upper quadrant. There is no guarding or rebound. Negative signs include Murphy's sign.     Hernia: No hernia is present.     Comments: With palpation of the right upper quadrant patient states that this increases discomfort in the right mid back.  Further with palpation of the remainder of the abdomen she states that this increases discomfort in the right upper quadrant.  Musculoskeletal:        General: Normal range of motion.     Cervical back: Normal range of motion and neck supple.     Right lower leg: No edema.     Left lower leg: No edema.  Skin:    General: Skin is warm and dry.     Capillary Refill: Capillary refill takes less than 2 seconds.  Neurological:     General: No focal deficit present.     Mental Status: She is alert. Mental status is at baseline.  Psychiatric:        Mood and Affect: Mood normal.     (all labs ordered are listed, but only abnormal results are displayed) Labs Reviewed  LIPASE, BLOOD - Abnormal; Notable for the following components:      Result Value   Lipase 174 (*)    All other components within normal limits  COMPREHENSIVE METABOLIC PANEL WITH GFR - Abnormal; Notable for the following components:   Anion gap 16 (*)    All other components within normal limits  CBC - Abnormal; Notable for the following components:   WBC 11.6 (*)    Hemoglobin 15.1 (*)    All other components within normal limits  URINALYSIS, ROUTINE W REFLEX MICROSCOPIC - Abnormal; Notable for the following components:   Hgb urine dipstick TRACE (*)    All other components within normal limits  URINALYSIS, MICROSCOPIC (REFLEX)  TRIGLYCERIDES    EKG: EKG  Interpretation Date/Time:  Tuesday December 23 2024 09:37:11 EST Ventricular Rate:  90 PR Interval:  137 QRS Duration:  91 QT Interval:  353 QTC Calculation: 432 R Axis:   -72  Text Interpretation: Sinus rhythm Left anterior fascicular block Abnormal R-wave progression, late transition Confirmed by Jerrol Agent ([id number]) on 12/23/2024 9:46:31 AM  Radiology: CT ABDOMEN PELVIS W CONTRAST Result Date: 12/23/2024 CLINICAL DATA:  Back pain and abdominal pain x5 days. EXAM: CT ABDOMEN AND PELVIS WITH CONTRAST TECHNIQUE: Multidetector CT imaging of the abdomen and pelvis was performed using the  standard protocol following bolus administration of intravenous contrast. RADIATION DOSE REDUCTION: This exam was performed according to the departmental dose-optimization program which includes automated exposure control, adjustment of the mA and/or kV according to patient size and/or use of iterative reconstruction technique. CONTRAST:  OMNIPAQUE  IOHEXOL  300 MG/ML  SOLN COMPARISON:  None Available. FINDINGS: Lower chest: No acute abnormality. Hepatobiliary: No focal liver abnormality is seen. No gallstones, gallbladder wall thickening, or biliary dilatation. Pancreas: A 2.0 cm x 4.2 cm x 1.8 cm area of heterogeneous predominately low-attenuation (approximately 70.15 Hounsfield units) is seen within an enlarged pancreatic head. A mild amount of associated peripancreatic inflammatory fat stranding is noted. There is no evidence of pancreatic ductal dilatation. Spleen: Normal in size without focal abnormality. Adrenals/Urinary Tract: Adrenal glands are unremarkable. Kidneys are normal, without renal calculi, focal lesion, or hydronephrosis. Bladder is unremarkable. Stomach/Bowel: Stomach is within normal limits. Appendix appears normal. No evidence of bowel dilatation. Moderate to marked severity mural thickening (approximately 6.54 mm) is seen involving the terminal ileum. Vascular/Lymphatic: Aortic atherosclerosis. No  enlarged abdominal or pelvic lymph nodes. Reproductive: Uterus and bilateral adnexa are unremarkable. Other: No abdominal wall hernia or abnormality. No abdominopelvic ascites. Musculoskeletal: No acute or significant osseous findings. IMPRESSION: 1. Findings consistent with acute pancreatitis with an associated pancreatic head pseudocyst. Correlation with pancreatic enzymes is recommended. Additional evaluation with MRI is recommended to exclude the presence of an underlying neoplastic process. 2. Moderate to marked severity terminal ileitis. Sequelae associated with Crohn's disease cannot be excluded. 3. Aortic atherosclerosis. Electronically Signed   By: Suzen Dials M.D.   On: 12/23/2024 12:19     Procedures   Medications Ordered in the ED  iohexol  (OMNIPAQUE ) 300 MG/ML solution 100 mL (100 mLs Intravenous Contrast Given 12/23/24 1149)                                    Medical Decision Making Amount and/or Complexity of Data Reviewed Labs: ordered. Radiology: ordered.  Risk Prescription drug management.   Medical Decision Making:   Monica Dillon is a 59 y.o. female who presented to the ED today with abdominal pain and fullness along with back pain detailed above.    Patient placed on continuous vitals and telemetry monitoring while in ED which was reviewed periodically.  Complete initial physical exam performed, notably the patient  was alert and oriented in no apparent distress.  Palpation of the abdomen showed tenderness most prominently in the right upper quadrant however is generalized.  Murphy sign negative.   Did endorse discomfort in the back with palpation of the right upper quadrant of the abdomen. Reviewed and confirmed nursing documentation for past medical history, family history, social history.    Initial Assessment:   With the patient's presentation of abdominal pain and back pain, differential diagnosis includes possible bowel obstruction or fecal impaction,  hepatobiliary disease, pancreatitis, colitis.   Initial Plan:  Obtain CT abdomen pelvis to assess for acute intra-abdominal pathology Screening labs including CBC and Metabolic panel to evaluate for infectious or metabolic etiology of disease.  Further assess serum lipase to evaluate for pancreatic etiology Urinalysis with reflex culture ordered to evaluate for UTI or relevant urologic/nephrologic pathology.  EKG to evaluate for cardiac pathology Objective evaluation as below reviewed   Initial Study Results:   Laboratory  All laboratory results reviewed without evidence of clinically relevant pathology.   Exceptions include: Leukocytosis of 11.6, lipase  of 174  EKG EKG was reviewed independently. Rate, rhythm, axis, intervals all examined and without medically relevant abnormality. ST segments without concerns for elevations.    Radiology:  All images reviewed independently. Agree with radiology report at this time.   CT ABDOMEN PELVIS W CONTRAST Result Date: 12/23/2024 CLINICAL DATA:  Back pain and abdominal pain x5 days. EXAM: CT ABDOMEN AND PELVIS WITH CONTRAST TECHNIQUE: Multidetector CT imaging of the abdomen and pelvis was performed using the standard protocol following bolus administration of intravenous contrast. RADIATION DOSE REDUCTION: This exam was performed according to the departmental dose-optimization program which includes automated exposure control, adjustment of the mA and/or kV according to patient size and/or use of iterative reconstruction technique. CONTRAST:  OMNIPAQUE  IOHEXOL  300 MG/ML  SOLN COMPARISON:  None Available. FINDINGS: Lower chest: No acute abnormality. Hepatobiliary: No focal liver abnormality is seen. No gallstones, gallbladder wall thickening, or biliary dilatation. Pancreas: A 2.0 cm x 4.2 cm x 1.8 cm area of heterogeneous predominately low-attenuation (approximately 70.15 Hounsfield units) is seen within an enlarged pancreatic head. A mild amount of  associated peripancreatic inflammatory fat stranding is noted. There is no evidence of pancreatic ductal dilatation. Spleen: Normal in size without focal abnormality. Adrenals/Urinary Tract: Adrenal glands are unremarkable. Kidneys are normal, without renal calculi, focal lesion, or hydronephrosis. Bladder is unremarkable. Stomach/Bowel: Stomach is within normal limits. Appendix appears normal. No evidence of bowel dilatation. Moderate to marked severity mural thickening (approximately 6.54 mm) is seen involving the terminal ileum. Vascular/Lymphatic: Aortic atherosclerosis. No enlarged abdominal or pelvic lymph nodes. Reproductive: Uterus and bilateral adnexa are unremarkable. Other: No abdominal wall hernia or abnormality. No abdominopelvic ascites. Musculoskeletal: No acute or significant osseous findings. IMPRESSION: 1. Findings consistent with acute pancreatitis with an associated pancreatic head pseudocyst. Correlation with pancreatic enzymes is recommended. Additional evaluation with MRI is recommended to exclude the presence of an underlying neoplastic process. 2. Moderate to marked severity terminal ileitis. Sequelae associated with Crohn's disease cannot be excluded. 3. Aortic atherosclerosis. Electronically Signed   By: Suzen Dials M.D.   On: 12/23/2024 12:19      Consults: Case discussed with Dr. Dianna with gastroenterology.   Reassessment and Plan:   On reassessment of patient she continues to have will manage pain without the need for analgesics.  CT imaging does demonstrate acute pancreatitis which correlates with increase in serum lipase.  There is also a marked severity terminal ileitis and noted concern for possible new onset Crohn's disease.  Discussed case with gastroenterology as noted.  They advised that based on the physical exam findings as well as current patient's status but they believe that she would not benefit from admission and should follow-up in the outpatient  setting.  As such, discussed this in depth with patient, and provided careful return precautions for reevaluation in the emergency department and likely admission at that time.  She voices understanding and agreement, was given instructions on clear liquid diet over the coming days.  She verbalizes understanding agreement has no further concerns at this time and thus will be discharged with outpatient follow-up.  A course of ondansetron  for nausea management and oxycodone  for as needed pain management was provided to the patient to be used in the outpatient setting, again reiterated return precautions which the patient vocalizes understanding and agreement.       Final diagnoses:  Idiopathic acute pancreatitis without infection or necrosis  Terminal ileitis without complication [facility])    ED Discharge Orders  Ordered    Ambulatory referral to Gastroenterology        12/23/24 1333    ondansetron  (ZOFRAN ) 4 MG tablet  Every 6 hours        12/23/24 1333    oxyCODONE -acetaminophen  (PERCOCET/ROXICET) 5-325 MG tablet  Every 6 hours PRN        12/23/24 1334               Myriam Dorn BROCKS, [place] 12/23/24 1417    Jerrol Agent, MD 12/23/24 1423  "

## 2024-12-23 NOTE — Discharge Instructions (Addendum)
 As we discussed, if despite treatment your pain begins to worsen, you increased nausea and vomiting and inability to tolerate even liquids, we need to see you back here in the emergency room for reassessment and likely admission.  Otherwise, referrals been placed to with the GI specialist that we discussed your case with earlier today.  Please reach out to them if you have not heard from them within the next 24 hours.

## 2024-12-23 NOTE — Telephone Encounter (Signed)
FYI. Pt going to ED.  

## 2024-12-23 NOTE — ED Notes (Signed)
 Patient transported to CT

## 2024-12-23 NOTE — Telephone Encounter (Signed)
 FYI Only or Action Required?: FYI only for provider: ED advised.  Patient was last seen in primary care on 09/30/2024 by Antonio Meth, Jamee SAUNDERS, DO.  Called Nurse Triage reporting Abdominal Cramping and Back Pain.  Symptoms began several days ago.  Interventions attempted: OTC medications: laxatives, tums, Rest, hydration, or home remedies, and Ice/heat application.  Symptoms are: gradually worsening.  Triage Disposition: Go to ED Now (Notify PCP)  Patient/caregiver understands and will follow disposition?: Yes    Copied from CRM [id number]. Topic: Clinical - Red Word Triage >> Dec 23, 2024  8:25 AM Mesmerise C wrote: [facility] that prompted transfer to Nurse Triage: Patient has been having bad stomach and back cramp or severe pain states it's been getting worse over the course of the last couple days would like an appt tomorrow Reason for Disposition  [1] Pain lasts > 10 minutes AND [2] age > 75 AND [3] at least one cardiac risk factor (e.g., diabetes, high cholesterol, hypertension, obesity, smoker or strong family history of heart disease)  Answer Assessment - Initial Assessment Questions This RN recommended that pt go to ED for symptoms, pt heading there now.    Actually over a week, just lower back pain, thought just stretched a muscle, put heating pads on it Last 5 days, right at top of stomach like under my ribs, cramping lasts for little while then goes away 2 days ago stomach feels really full Not eating as much now, feel real full Thought maybe stopped up, took laxatives, did lot of liquid Seemed to be getting worse, stopped taking anything Thought maybe acid, tried tums Denies heartburn or feelings of indigestion Pressure in stomach feels full No SOB, heart racing, cold/clammy skin, not too weak to stand, changes to awareness or consciousness, excessive sweating, or chest pain Getting muscle cramps in legs at night 2/10 right now just full Will be 6/10 hurts pretty good  when cramping, cramps real hard then will let go then will cramp real hard Back is steady 3/10 midback opposite of stomach, kind of like a wrap-around Know I'm bloated, not been able to button a couple pants lately Thought maybe  Cramped for a good hour before settle down Have felt like need to get sick because so full Stools kind of normal, had some liquid stools when took laxatives Right now back is sore, no abdominal pain right now just feels real full Advised pt be examined in next couple hours Almost went to ED last night but too busy so went to bed Nurse then recommends ED with pain being that significant  Protocols used: Abdominal Pain - Upper-A-AH

## 2024-12-23 NOTE — ED Notes (Signed)
 ED Provider at bedside.

## 2024-12-25 ENCOUNTER — Encounter (HOSPITAL_COMMUNITY): Payer: Self-pay | Admitting: Emergency Medicine

## 2024-12-25 ENCOUNTER — Emergency Department (HOSPITAL_COMMUNITY)
Admission: EM | Admit: 2024-12-25 | Discharge: 2024-12-25 | Disposition: A | Discharge status: Home / Self Care | Attending: Emergency Medicine | Admitting: Emergency Medicine

## 2024-12-25 ENCOUNTER — Other Ambulatory Visit: Payer: Self-pay

## 2024-12-25 DIAGNOSIS — D72829 Elevated white blood cell count, unspecified: Secondary | ICD-10-CM | POA: Insufficient documentation

## 2024-12-25 DIAGNOSIS — R112 Nausea with vomiting, unspecified: Secondary | ICD-10-CM

## 2024-12-25 DIAGNOSIS — R739 Hyperglycemia, unspecified: Secondary | ICD-10-CM | POA: Insufficient documentation

## 2024-12-25 DIAGNOSIS — K859 Acute pancreatitis without necrosis or infection, unspecified: Secondary | ICD-10-CM | POA: Diagnosis not present

## 2024-12-25 DIAGNOSIS — R1013 Epigastric pain: Secondary | ICD-10-CM | POA: Diagnosis present

## 2024-12-25 LAB — COMPREHENSIVE METABOLIC PANEL WITH GFR
ALT: 9 U/L (ref 0–44)
AST: 16 U/L (ref 15–41)
Albumin: 4.2 g/dL (ref 3.5–5.0)
Alkaline Phosphatase: 65 U/L (ref 38–126)
Anion gap: 11 (ref 5–15)
BUN: 8 mg/dL (ref 6–20)
CO2: 30 mmol/L (ref 22–32)
Calcium: 9.4 mg/dL (ref 8.9–10.3)
Chloride: 96 mmol/L — ABNORMAL LOW (ref 98–111)
Creatinine, Ser: 0.64 mg/dL (ref 0.44–1.00)
GFR, Estimated: >60 mL/min
Glucose, Bld: 139 mg/dL — ABNORMAL HIGH (ref 70–99)
Potassium: 3.5 mmol/L (ref 3.5–5.1)
Sodium: 136 mmol/L (ref 135–145)
Total Bilirubin: 0.3 mg/dL (ref 0.0–1.2)
Total Protein: 7.3 g/dL (ref 6.5–8.1)

## 2024-12-25 LAB — URINALYSIS, ROUTINE W REFLEX MICROSCOPIC
Bacteria, UA: NONE SEEN
Bilirubin Urine: NEGATIVE
Glucose, UA: NEGATIVE mg/dL
Ketones, ur: NEGATIVE mg/dL
Leukocytes,Ua: NEGATIVE
Nitrite: NEGATIVE
Protein, ur: NEGATIVE mg/dL
Specific Gravity, Urine: 1.005 (ref 1.005–1.030)
pH: 5 (ref 5.0–8.0)

## 2024-12-25 LAB — CBC WITH DIFFERENTIAL/PLATELET
Abs Immature Granulocytes: 0.03 K/uL (ref 0.00–0.07)
Basophils Absolute: 0.1 K/uL (ref 0.0–0.1)
Basophils Relative: 1 %
Eosinophils Absolute: 0.2 K/uL (ref 0.0–0.5)
Eosinophils Relative: 2 %
HCT: 41 % (ref 36.0–46.0)
Hemoglobin: 14.1 g/dL (ref 12.0–15.0)
Immature Granulocytes: 0 %
Lymphocytes Relative: 19 %
Lymphs Abs: 2.2 K/uL (ref 0.7–4.0)
MCH: 31.1 pg (ref 26.0–34.0)
MCHC: 34.4 g/dL (ref 30.0–36.0)
MCV: 90.5 fL (ref 80.0–100.0)
Monocytes Absolute: 0.8 K/uL (ref 0.1–1.0)
Monocytes Relative: 7 %
Neutro Abs: 8.1 K/uL — ABNORMAL HIGH (ref 1.7–7.7)
Neutrophils Relative %: 71 %
Platelets: 363 K/uL (ref 150–400)
RBC: 4.53 MIL/uL (ref 3.87–5.11)
RDW: 11.6 % (ref 11.5–15.5)
WBC: 11.4 K/uL — ABNORMAL HIGH (ref 4.0–10.5)
nRBC: 0 % (ref 0.0–0.2)

## 2024-12-25 LAB — LIPASE, BLOOD: Lipase: 107 U/L — ABNORMAL HIGH (ref 11–51)

## 2024-12-25 MED ORDER — SODIUM CHLORIDE 0.9 % IV BOLUS
1000.0000 mL | Freq: Once | INTRAVENOUS | Status: AC
  Administered 2024-12-25: 1000 mL via INTRAVENOUS

## 2024-12-25 MED ORDER — ONDANSETRON 8 MG PO TBDP: 8.0000 mg | ORAL_TABLET | Freq: Three times a day (TID) | ORAL | 0 refills | Status: DC | PRN

## 2024-12-25 MED ORDER — OXYCODONE-ACETAMINOPHEN 5-325 MG PO TABS: 1.0000 | ORAL_TABLET | Freq: Four times a day (QID) | ORAL | 0 refills | Status: DC | PRN

## 2024-12-25 MED ORDER — ONDANSETRON HCL 4 MG/2ML IJ SOLN
4.0000 mg | Freq: Once | INTRAMUSCULAR | Status: AC
  Administered 2024-12-25: 4 mg via INTRAVENOUS
  Filled 2024-12-25: qty 2

## 2024-12-25 MED ORDER — MORPHINE SULFATE (PF) 4 MG/ML IV SOLN
4.0000 mg | Freq: Once | INTRAVENOUS | Status: AC
  Administered 2024-12-25: 4 mg via INTRAVENOUS
  Filled 2024-12-25: qty 1

## 2024-12-25 NOTE — ED Triage Notes (Signed)
 Patient c/o abdominal pain x 2 weeks. Patient was seen 2 days ago with same complain.  Patient report she's taking PRN pain medication that was prescribe for her 2 days ago with no relief. Patient report worsening pain tonight. Patient report nausea denies vomiting. Patient denies fever.

## 2024-12-25 NOTE — ED Provider Notes (Signed)
 " [facility] EMERGENCY DEPARTMENT AT [facility] Provider Note   CSN: [id number] Arrival date & time: 12/25/24  9650     Patient presents with: Abdominal Pain   Monica Dillon is a 59 y.o. female.   The history is provided by the patient.  Abdominal Pain  She was in the emergency department 2 days ago and diagnosed with pancreatitis and discharged with prescriptions for ondansetron  and oxycodone -acetaminophen .  She complains of ongoing pain in the epigastric area which radiates to her back, and which is only minimally relieved with oxycodone -acetaminophen .  Nausea is mostly controlled with ondansetron .  She is a nondrinker, no known history of severe hypertriglyceridemia or gallstones.    Prior to Admission medications  Medication Sig Start Date End Date Taking? Authorizing Provider  acetaminophen  (TYLENOL ) 325 MG tablet Take 650 mg by mouth every 6 (six) hours as needed for headache.    [provider]  albuterol  (VENTOLIN  HFA) 108 (90 Base) MCG/ACT inhaler TAKE 2 PUFFS BY MOUTH EVERY 6 HOURS AS NEEDED FOR WHEEZE OR SHORTNESS OF BREATH 07/09/23   Domenica Harlene LABOR, MD  Azelastine  HCl 137 MCG/SPRAY SOLN PLACE 1 SPRAY INTO BOTH NOSTRILS 2 (TWO) TIMES DAILY. USE IN EACH NOSTRIL AS DIRECTED 08/20/24   Domenica Harlene LABOR, MD  cetirizine  (ZYRTEC ) 10 MG tablet Take 1 tablet (10 mg total) by mouth 2 (two) times daily as needed for allergies. 04/29/23   Fleming, Zelda W, NP  dicyclomine  (BENTYL ) 10 MG capsule Take 1 capsule (10 mg total) by mouth 4 (four) times daily -  before meals and at bedtime. 09/30/24   Lowne Chase, Yvonne R, DO  Fluticasone-Umeclidin-Vilant (TRELEGY ELLIPTA ) 200-62.5-25 MCG/ACT AEPB Inhale 1 puff into the lungs daily. 05/01/24   Hunsucker, Donnice SAUNDERS, MD  ondansetron  (ZOFRAN ) 4 MG tablet Take 1 tablet (4 mg total) by mouth every 6 (six) hours. 12/23/24   Myriam Dorn BROCKS, PA  oxyCODONE -acetaminophen  (PERCOCET/ROXICET) 5-325 MG tablet Take 1 tablet by mouth every 6  (six) hours as needed for severe pain (pain score 7-10). 12/23/24   Myriam Dorn BROCKS, PA    Allergies: Lipitor [atorvastatin ]    Review of Systems  Gastrointestinal:  Positive for abdominal pain.  All other systems reviewed and are negative.   Updated Vital Signs BP 132/87   Pulse 85   Temp 98.8 F (37.1 C)   Resp 16   LMP 01/08/2017   SpO2 100%   Physical Exam Vitals and nursing note reviewed.   59 year old female, resting comfortably and in no acute distress. Vital signs are normal. Oxygen saturation is 100%, which is normal. Head is normocephalic and atraumatic. PERRLA, EOMI.  Lungs are clear without rales, wheezes, or rhonchi. Chest is nontender. Heart has regular rate and rhythm without murmur. Abdomen is soft, flat, with moderate to severe epigastric tenderness.  There is no rebound or guarding. Extremities have no cyanosis or edema, full range of motion is present. Skin is warm and dry without rash. Neurologic: Mental status is normal, cranial nerves are intact, moves all extremities equally.  (all labs ordered are listed, but only abnormal results are displayed) Labs Reviewed  LIPASE, BLOOD - Abnormal; Notable for the following components:      Result Value   Lipase 107 (*)    All other components within normal limits  COMPREHENSIVE METABOLIC PANEL WITH GFR - Abnormal; Notable for the following components:   Chloride 96 (*)    Glucose, Bld 139 (*)    All other  components within normal limits  URINALYSIS, ROUTINE W REFLEX MICROSCOPIC - Abnormal; Notable for the following components:   Hgb urine dipstick MODERATE (*)    All other components within normal limits  CBC WITH DIFFERENTIAL/PLATELET - Abnormal; Notable for the following components:   WBC 11.4 (*)    Neutro Abs 8.1 (*)    All other components within normal limits      Procedures   Medications Ordered in the ED  sodium chloride  0.9 % bolus 1,000 mL (has no administration in time range)   ondansetron  (ZOFRAN ) injection 4 mg (has no administration in time range)  morphine  (PF) 4 MG/ML injection 4 mg (has no administration in time range)                                    Medical Decision Making Amount and/or Complexity of Data Reviewed Labs: ordered.  Risk Prescription drug management.   Abdominal pain in patient with recent diagnosis of pancreatitis.  Pain is consistent with known history of pancreatitis.  I reviewed her past records, and note ED visit on 12/23/2024 at which time lipase was elevated at 174 with normal transaminases, and CT scan showed acute pancreatitis with pancreatic head pseudocyst.  No indication for repeat imaging.  I have reviewed her lipid profile, and high triglyceride level was 278 on 05/23/2023, but triglycerides were normal in 05/27/2024.  I have ordered repeat laboratory testing, IV fluids, morphine  for pain, ondansetron  for nausea.  I have reviewed her laboratory tests, and my interpretation is decreasing lipase, elevated random glucose which will need to be followed as an outpatient, stable leukocytosis.  She feels much better following above-noted treatment.  I am discharging her with prescription for additional oxycodone -acetaminophen  with instructions that she can increase it to 2 tablets per dose if needed, and also a prescription for ondansetron  oral dissolving tablet so that she will be able to fight nausea even if she is vomiting.  I have encouraged her to follow-up with the gastroenterologist.     Final diagnoses:  Acute pancreatitis without infection or necrosis, unspecified pancreatitis type  Elevated random blood glucose level  Nausea and vomiting, unspecified vomiting type    ED Discharge Orders          Ordered    oxyCODONE -acetaminophen  (PERCOCET/ROXICET) 5-325 MG tablet  Every 6 hours PRN        12/25/24 0653    ondansetron  (ZOFRAN -ODT) 8 MG disintegrating tablet  Every 8 hours PRN        12/25/24 0653                Raford Lenis, MD 12/25/24 [phone]  "

## 2024-12-25 NOTE — Discharge Instructions (Addendum)
 Continue to take oxycodone -acetaminophen  as needed for pain.  If pain is severe, you may take 2 tablets at a time.  Continue using ondansetron  for nausea.  You may benefit from taking ondansetron  before taking the pain medication to prevent nausea from the medication.  Please make sure to follow-up with a gastroenterologist.  Return to the emergency department if symptoms or not being adequately controlled at home.

## 2024-12-26 ENCOUNTER — Ambulatory Visit: Admitting: Student

## 2024-12-26 ENCOUNTER — Encounter: Payer: Self-pay | Admitting: Student

## 2024-12-26 VITALS — BP 106/76 | HR 82 | Temp 97.8°F | Ht 60.0 in | Wt 117.0 lb

## 2024-12-26 DIAGNOSIS — Z0289 Encounter for other administrative examinations: Secondary | ICD-10-CM | POA: Diagnosis not present

## 2024-12-26 DIAGNOSIS — K861 Other chronic pancreatitis: Secondary | ICD-10-CM | POA: Insufficient documentation

## 2024-12-26 DIAGNOSIS — K859 Acute pancreatitis without necrosis or infection, unspecified: Secondary | ICD-10-CM | POA: Insufficient documentation

## 2024-12-26 DIAGNOSIS — R935 Abnormal findings on diagnostic imaging of other abdominal regions, including retroperitoneum: Secondary | ICD-10-CM | POA: Insufficient documentation

## 2024-12-26 DIAGNOSIS — R3911 Hesitancy of micturition: Secondary | ICD-10-CM

## 2024-12-26 DIAGNOSIS — R1013 Epigastric pain: Secondary | ICD-10-CM | POA: Diagnosis not present

## 2024-12-26 DIAGNOSIS — K863 Pseudocyst of pancreas: Secondary | ICD-10-CM | POA: Diagnosis not present

## 2024-12-26 LAB — POCT URINALYSIS DIPSTICK
Bilirubin, UA: NEGATIVE
Glucose, UA: NEGATIVE
Ketones, UA: NEGATIVE
Leukocytes, UA: NEGATIVE
Nitrite, UA: NEGATIVE
Protein, UA: NEGATIVE
Spec Grav, UA: <=1.005 — AB
Urobilinogen, UA: 0.2 U/dL
pH, UA: 5

## 2024-12-26 LAB — TRIGLYCERIDES: Triglycerides: 146 mg/dL

## 2024-12-26 NOTE — Progress Notes (Signed)
 "  Acute Office Visit  Subjective:     Patient ID: Monica Dillon, female    DOB: 01/04/1966, 59 y.o.   MRN: [id number]  Chief Complaint  Patient presents with   Hospitalization Follow-up    Patient is here to follow up for her hospital visit on yesterday and 12/23/2024 : Dx of Pancreatitis     HPI   History of Present Illness Monica Dillon is a 59 year old female who presents for hospital FU visit with c/o epigastric pain.  She has persistent right-sided abdominal and back pain diagnosed as acute pancreatitis during a recent ER visit on 12/24/2023. The pain is constant, partially relieved by leaning forward. Pain is worse today in office as she has not taken pain medication since morning. She has no prior pancreatitis or gallbladder issues. Pain is managed with Percocet, last dose at 6 AM today, and she has not yet filled her new prescription. Zofran  at an increased dose from her second ER visit controls her nausea.She has changed her diet to avoid fatty foods and alcohol, and is taking mostly clear liquids and bland foods. Cold foods worsen her stomach discomfort. Her bowel movements are thicker since symptom onset, and she has had no bowel movement since Tuesday, which she attributes to decreased intake and pain medication.Denies ETOH use.  She denies dysuria and frequency, but notes some hesitancy. Recent urine testing in ED showed moderate blood   Recent hospitalization Location: [facility] Dx: Acute pancreatitis  CT abdomen and Pelvis 12/23/2024  IMPRESSION: 1. Findings consistent with acute pancreatitis with an associated pancreatic head pseudocyst. Correlation with pancreatic enzymes is recommended. Additional evaluation with MRI is recommended to exclude the presence of an underlying neoplastic process. 2. Moderate to marked severity terminal ileitis. Sequelae associated with Crohn's disease cannot be excluded. 3. Aortic atherosclerosis.   Patient denies fever, chills, SOB, CP,  palpitations, dyspnea, edema, HA, vision changes, rash, weight changes.  ROS  See HPI    Objective:    BP 106/76 (BP Location: Left Arm, Patient Position: Sitting)   Pulse 82   Temp 97.8 F (36.6 C) (Oral)   Ht 5' (1.524 m)   Wt 117 lb (53.1 kg)   LMP 01/08/2017   SpO2 97%   BMI 22.85 kg/m    Physical Exam  Constitutional:      General: She is not in acute distress.    Appearance: Normal appearance. She is well-developed and normal weight.  HENT:     Head: Normocephalic and atraumatic.     Mouth/Throat:     Mouth: Mucous membranes are moist.     Pharynx: Oropharynx is clear.  Eyes:     General: No scleral icterus.    Extraocular Movements: Extraocular movements intact.     Conjunctiva/sclera: Conjunctivae normal.     Pupils: Pupils are equal, round, and reactive to light.  Cardiovascular:     Rate and Rhythm: Normal rate and regular rhythm.     Pulses: Normal pulses.     Heart sounds: Normal heart sounds. No murmur heard.    No friction rub. No gallop.  Pulmonary:     Effort: Pulmonary effort is normal.     Breath sounds: Normal breath sounds.  Abdominal:     General: Abdomen is flat. Bowel sounds are normal.     Palpations: Abdomen is soft.     Tenderness: There is generalized abdominal tenderness and tenderness in the right upper quadrant. There is no guarding or rebound.  Hernia: No hernia is present.  Musculoskeletal:        General: Normal range of motion.     Cervical back: Normal range of motion and neck supple.     Right lower leg: No edema.     Left lower leg: No edema.  Skin:    General: Skin is warm and dry.     Capillary Refill: Capillary refill takes less than 2 seconds.  Neurological:     General: No focal deficit present.     Mental Status: She is alert. Mental status is at baseline.  Psychiatric:        Mood and Affect: Mood norma   No results found for any visits on 12/26/24.      Assessment & Plan:   Problem List Items  Addressed This Visit       Digestive   Acute pancreatitis - Primary   Relevant Orders   Ambulatory referral to Gastroenterology   Lipase   Hepatic function panel   Comp Met (CMET)   US  Abdomen Limited RUQ (LIVER/GB)   CBC   Pancreatic pseudocyst   Relevant Orders   MR Abdomen W Wo Contrast     Other   Abnormal abdominal CT scan   Relevant Orders   Ambulatory referral to Gastroenterology   Other Visit Diagnoses       Epigastric pain         Encounter for completion of form with patient         Urinary hesitancy       Relevant Orders   POCT Urinalysis Dipstick   Urine Culture      Acute pancreatitis with pancreatic pseudocyst Acute pancreatitis confirmed by CT  - Ordered MRI of the abdomen to evaluate pancreatic pseudocyst. - Ordered ultrasound to assess for gallbladder involvement. Repeat CMP,CBC, Lipase, Hepatic panel - Referred to gastroenterology for further evaluation and management. - Advised to avoid alcohol and fatty foods. - Continue Zofran  for nausea management. - Continue Percocet for pain management. -Ensure adequate hydration - Advised dietary modifications: bland diet, small frequent meals, avoid fatty food, avoid ETOH Strict ED precautions immediately for worsening pain, vomiting, fever, or inability to tolerate fluids. Pt voices understanding and agreement with this plan  No orders of the defined types were placed in this encounter.   No follow-ups on file.  Harlene LITTIE Jolly, NP   "

## 2024-12-27 ENCOUNTER — Ambulatory Visit (HOSPITAL_BASED_OUTPATIENT_CLINIC_OR_DEPARTMENT_OTHER): Admission: RE | Admit: 2024-12-27 | Discharge: 2024-12-27 | Attending: Student

## 2024-12-27 DIAGNOSIS — K859 Acute pancreatitis without necrosis or infection, unspecified: Secondary | ICD-10-CM | POA: Diagnosis present

## 2024-12-27 LAB — COMPREHENSIVE METABOLIC PANEL WITH GFR
AG Ratio: 1.6 (calc) (ref 1.0–2.5)
ALT: 11 U/L (ref 6–29)
AST: 13 U/L (ref 10–35)
Albumin: 4.5 g/dL (ref 3.6–5.1)
Alkaline phosphatase (APISO): 62 U/L (ref 37–153)
BUN/Creatinine Ratio: 10 (calc) (ref 6–22)
BUN: 6 mg/dL — ABNORMAL LOW (ref 7–25)
CO2: 30 mmol/L (ref 20–32)
Calcium: 9.7 mg/dL (ref 8.6–10.4)
Chloride: 99 mmol/L (ref 98–110)
Creat: 0.62 mg/dL (ref 0.50–1.03)
Globulin: 2.8 g/dL (ref 1.9–3.7)
Glucose, Bld: 88 mg/dL (ref 65–99)
Potassium: 3.4 mmol/L — ABNORMAL LOW (ref 3.5–5.3)
Sodium: 138 mmol/L (ref 135–146)
Total Bilirubin: 0.3 mg/dL (ref 0.2–1.2)
Total Protein: 7.3 g/dL (ref 6.1–8.1)
eGFR: 103 mL/min/1.73m2

## 2024-12-27 LAB — HEPATIC FUNCTION PANEL
AG Ratio: 1.6 (calc) (ref 1.0–2.5)
ALT: 11 U/L (ref 6–29)
AST: 13 U/L (ref 10–35)
Albumin: 4.5 g/dL (ref 3.6–5.1)
Alkaline phosphatase (APISO): 62 U/L (ref 37–153)
Bilirubin, Direct: 0.1 mg/dL (ref 0.0–0.2)
Globulin: 2.8 g/dL (ref 1.9–3.7)
Indirect Bilirubin: 0.2 mg/dL (ref 0.2–1.2)
Total Bilirubin: 0.3 mg/dL (ref 0.2–1.2)
Total Protein: 7.3 g/dL (ref 6.1–8.1)

## 2024-12-27 LAB — LIPASE: Lipase: 170 U/L — ABNORMAL HIGH (ref 7–60)

## 2024-12-27 LAB — URINE CULTURE
MICRO NUMBER:: 17449321
Result:: NO GROWTH
SPECIMEN QUALITY:: ADEQUATE

## 2024-12-27 LAB — CBC
HCT: 40.3 % (ref 35.9–46.0)
Hemoglobin: 13.8 g/dL (ref 11.7–15.5)
MCH: 30.9 pg (ref 27.0–33.0)
MCHC: 34.2 g/dL (ref 31.6–35.4)
MCV: 90.2 fL (ref 81.4–101.7)
MPV: 10.3 fL (ref 7.5–12.5)
Platelets: 376 Thousand/uL (ref 140–400)
RBC: 4.47 Million/uL (ref 3.80–5.10)
RDW: 11.9 % (ref 11.0–15.0)
WBC: 12.2 Thousand/uL — ABNORMAL HIGH (ref 3.8–10.8)

## 2024-12-29 ENCOUNTER — Ambulatory Visit: Payer: Self-pay | Admitting: Student

## 2024-12-29 ENCOUNTER — Encounter: Payer: Self-pay | Admitting: Student

## 2024-12-29 DIAGNOSIS — F418 Other specified anxiety disorders: Secondary | ICD-10-CM

## 2024-12-29 DIAGNOSIS — K859 Acute pancreatitis without necrosis or infection, unspecified: Secondary | ICD-10-CM

## 2024-12-29 NOTE — Progress Notes (Signed)
 Monica Dillon   I would like to continue to monitor your lab work closely to be sure everything is trending in the right direction. I have placed orders for repeat CBC, BMP, and lipase. Please plan to come in for labs within the next 24-48 hours (you may come as early as tomorrow and can schedule lab appointment at your convenience).  Please seek immediate medical care  if you develop worsening or severe symptoms (abdominal pain, fever, persistent vomiting, inability to keep fluids down). Best,  Harlene Jolly, DNP, AGNP-BC

## 2024-12-30 ENCOUNTER — Other Ambulatory Visit (INDEPENDENT_AMBULATORY_CARE_PROVIDER_SITE_OTHER)

## 2024-12-30 ENCOUNTER — Ambulatory Visit: Payer: Self-pay | Admitting: Student

## 2024-12-30 DIAGNOSIS — K859 Acute pancreatitis without necrosis or infection, unspecified: Secondary | ICD-10-CM

## 2024-12-30 LAB — COMPREHENSIVE METABOLIC PANEL WITH GFR
ALT: 9 U/L (ref 3–35)
AST: 11 U/L (ref 5–37)
Albumin: 4.3 g/dL (ref 3.5–5.2)
Alkaline Phosphatase: 58 U/L (ref 39–117)
BUN: 6 mg/dL (ref 6–23)
CO2: 32 meq/L (ref 19–32)
Calcium: 9.4 mg/dL (ref 8.4–10.5)
Chloride: 96 meq/L (ref 96–112)
Creatinine, Ser: 0.65 mg/dL (ref 0.40–1.20)
GFR: 97.11 mL/min
Glucose, Bld: 71 mg/dL (ref 70–99)
Potassium: 3.2 meq/L — ABNORMAL LOW (ref 3.5–5.1)
Sodium: 137 meq/L (ref 135–145)
Total Bilirubin: 0.4 mg/dL (ref 0.2–1.2)
Total Protein: 7.1 g/dL (ref 6.0–8.3)

## 2024-12-30 LAB — CBC
HCT: 39.8 % (ref 36.0–46.0)
Hemoglobin: 14 g/dL (ref 12.0–15.0)
MCHC: 35.3 g/dL (ref 30.0–36.0)
MCV: 88.8 fl (ref 78.0–100.0)
Platelets: 395 K/uL (ref 150.0–400.0)
RBC: 4.48 Mil/uL (ref 3.87–5.11)
RDW: 12.4 % (ref 11.5–15.5)
WBC: 12.6 K/uL — ABNORMAL HIGH (ref 4.0–10.5)

## 2024-12-30 LAB — LIPASE: Lipase: 1029 U/L — ABNORMAL HIGH (ref 11.0–59.0)

## 2024-12-30 MED ORDER — DIAZEPAM 5 MG PO TABS: 5.0000 mg | ORAL_TABLET | Freq: Once | ORAL | 0 refills | Status: AC

## 2025-01-01 ENCOUNTER — Other Ambulatory Visit: Payer: Self-pay

## 2025-01-01 ENCOUNTER — Encounter (HOSPITAL_COMMUNITY): Payer: Self-pay

## 2025-01-01 ENCOUNTER — Other Ambulatory Visit: Payer: Self-pay | Admitting: Student

## 2025-01-01 ENCOUNTER — Observation Stay (HOSPITAL_COMMUNITY)
Admission: EM | Admit: 2025-01-01 | Discharge: 2025-01-04 | Disposition: A | Discharge status: Home / Self Care | Attending: Emergency Medicine | Admitting: Emergency Medicine

## 2025-01-01 ENCOUNTER — Ambulatory Visit (HOSPITAL_COMMUNITY): Admission: RE | Admit: 2025-01-01 | Discharge: 2025-01-01 | Attending: Student

## 2025-01-01 DIAGNOSIS — R935 Abnormal findings on diagnostic imaging of other abdominal regions, including retroperitoneum: Secondary | ICD-10-CM | POA: Diagnosis not present

## 2025-01-01 DIAGNOSIS — F32A Depression, unspecified: Secondary | ICD-10-CM | POA: Diagnosis present

## 2025-01-01 DIAGNOSIS — F419 Anxiety disorder, unspecified: Secondary | ICD-10-CM | POA: Diagnosis not present

## 2025-01-01 DIAGNOSIS — K861 Other chronic pancreatitis: Secondary | ICD-10-CM | POA: Insufficient documentation

## 2025-01-01 DIAGNOSIS — F17210 Nicotine dependence, cigarettes, uncomplicated: Secondary | ICD-10-CM | POA: Insufficient documentation

## 2025-01-01 DIAGNOSIS — K859 Acute pancreatitis without necrosis or infection, unspecified: Secondary | ICD-10-CM | POA: Diagnosis not present

## 2025-01-01 DIAGNOSIS — K863 Pseudocyst of pancreas: Secondary | ICD-10-CM | POA: Insufficient documentation

## 2025-01-01 DIAGNOSIS — E876 Hypokalemia: Secondary | ICD-10-CM | POA: Diagnosis not present

## 2025-01-01 DIAGNOSIS — K219 Gastro-esophageal reflux disease without esophagitis: Secondary | ICD-10-CM | POA: Diagnosis not present

## 2025-01-01 DIAGNOSIS — J45909 Unspecified asthma, uncomplicated: Secondary | ICD-10-CM | POA: Diagnosis not present

## 2025-01-01 DIAGNOSIS — F418 Other specified anxiety disorders: Secondary | ICD-10-CM | POA: Diagnosis not present

## 2025-01-01 DIAGNOSIS — E785 Hyperlipidemia, unspecified: Secondary | ICD-10-CM | POA: Diagnosis not present

## 2025-01-01 DIAGNOSIS — R109 Unspecified abdominal pain: Secondary | ICD-10-CM | POA: Diagnosis present

## 2025-01-01 LAB — COMPREHENSIVE METABOLIC PANEL WITH GFR
ALT: 10 U/L (ref 0–44)
AST: 15 U/L (ref 15–41)
Albumin: 4.2 g/dL (ref 3.5–5.0)
Alkaline Phosphatase: 69 U/L (ref 38–126)
Anion gap: 12 (ref 5–15)
BUN: 7 mg/dL (ref 6–20)
CO2: 30 mmol/L (ref 22–32)
Calcium: 9.4 mg/dL (ref 8.9–10.3)
Chloride: 95 mmol/L — ABNORMAL LOW (ref 98–111)
Creatinine, Ser: 0.64 mg/dL (ref 0.44–1.00)
GFR, Estimated: >60 mL/min
Glucose, Bld: 110 mg/dL — ABNORMAL HIGH (ref 70–99)
Potassium: 3.5 mmol/L (ref 3.5–5.1)
Sodium: 136 mmol/L (ref 135–145)
Total Bilirubin: 0.4 mg/dL (ref 0.0–1.2)
Total Protein: 7.7 g/dL (ref 6.5–8.1)

## 2025-01-01 LAB — CBC
HCT: 42.2 % (ref 36.0–46.0)
Hemoglobin: 14.7 g/dL (ref 12.0–15.0)
MCH: 31.6 pg (ref 26.0–34.0)
MCHC: 34.8 g/dL (ref 30.0–36.0)
MCV: 90.8 fL (ref 80.0–100.0)
Platelets: 395 K/uL (ref 150–400)
RBC: 4.65 MIL/uL (ref 3.87–5.11)
RDW: 11.7 % (ref 11.5–15.5)
WBC: 11.8 K/uL — ABNORMAL HIGH (ref 4.0–10.5)
nRBC: 0 % (ref 0.0–0.2)

## 2025-01-01 LAB — LIPASE, BLOOD: Lipase: 421 U/L — ABNORMAL HIGH (ref 11–51)

## 2025-01-01 MED ORDER — ONDANSETRON HCL 4 MG/2ML IJ SOLN
4.0000 mg | Freq: Four times a day (QID) | INTRAMUSCULAR | Status: DC | PRN
  Administered 2025-01-02: 4 mg via INTRAVENOUS
  Filled 2025-01-01: qty 2

## 2025-01-01 MED ORDER — LACTATED RINGERS IV BOLUS
1000.0000 mL | Freq: Once | INTRAVENOUS | Status: AC
  Administered 2025-01-01: 1000 mL via INTRAVENOUS

## 2025-01-01 MED ORDER — ACETAMINOPHEN 325 MG PO TABS: 650.0000 mg | ORAL_TABLET | Freq: Four times a day (QID) | ORAL | Status: DC | PRN

## 2025-01-01 MED ORDER — SODIUM CHLORIDE 0.9 % IV SOLN: INTRAVENOUS | Status: AC

## 2025-01-01 MED ORDER — BUDESON-GLYCOPYRROL-FORMOTEROL 160-9-4.8 MCG/ACT IN AERO
2.0000 | INHALATION_SPRAY | Freq: Two times a day (BID) | RESPIRATORY_TRACT | Status: DC
  Administered 2025-01-01 – 2025-01-04 (×6): 2 via RESPIRATORY_TRACT
  Filled 2025-01-01: qty 5.9

## 2025-01-01 MED ORDER — GADOBUTROL 1 MMOL/ML IV SOLN
5.0000 mL | Freq: Once | INTRAVENOUS | Status: AC | PRN
  Administered 2025-01-01: 5 mL via INTRAVENOUS

## 2025-01-01 MED ORDER — POLYETHYLENE GLYCOL 3350 17 G PO PACK: 17.0000 g | PACK | Freq: Every day | ORAL | Status: DC | PRN

## 2025-01-01 MED ORDER — HYDROMORPHONE HCL 1 MG/ML IJ SOLN: 0.5000 mg | INTRAMUSCULAR | Status: DC | PRN

## 2025-01-01 MED ORDER — SODIUM CHLORIDE 0.9% FLUSH
3.0000 mL | Freq: Two times a day (BID) | INTRAVENOUS | Status: DC
  Administered 2025-01-02 – 2025-01-04 (×5): 3 mL via INTRAVENOUS

## 2025-01-01 MED ORDER — ENOXAPARIN SODIUM 40 MG/0.4ML IJ SOSY
40.0000 mg | PREFILLED_SYRINGE | INTRAMUSCULAR | Status: DC
  Administered 2025-01-01 – 2025-01-03 (×3): 40 mg via SUBCUTANEOUS
  Filled 2025-01-01 (×3): qty 0.4

## 2025-01-01 MED ORDER — OXYCODONE HCL 5 MG PO TABS
5.0000 mg | ORAL_TABLET | ORAL | Status: DC | PRN
  Administered 2025-01-01 – 2025-01-04 (×9): 10 mg via ORAL
  Filled 2025-01-01 (×9): qty 2

## 2025-01-01 MED ORDER — ACETAMINOPHEN 650 MG RE SUPP: 650.0000 mg | Freq: Four times a day (QID) | RECTAL | Status: DC | PRN

## 2025-01-01 MED ORDER — ONDANSETRON 4 MG PO TBDP
4.0000 mg | ORAL_TABLET | Freq: Once | ORAL | Status: AC
  Administered 2025-01-01: 4 mg via ORAL
  Filled 2025-01-01: qty 1

## 2025-01-01 MED ORDER — DIAZEPAM 5 MG PO TABS: 5.0000 mg | ORAL_TABLET | Freq: Every day | ORAL | Status: DC | PRN

## 2025-01-01 MED ORDER — ONDANSETRON HCL 4 MG PO TABS
4.0000 mg | ORAL_TABLET | Freq: Four times a day (QID) | ORAL | Status: DC | PRN
  Administered 2025-01-02 – 2025-01-03 (×4): 4 mg via ORAL
  Filled 2025-01-01 (×4): qty 1

## 2025-01-01 MED ORDER — OXYCODONE-ACETAMINOPHEN 5-325 MG PO TABS
1.0000 | ORAL_TABLET | Freq: Once | ORAL | Status: AC
  Administered 2025-01-01: 1 via ORAL
  Filled 2025-01-01: qty 1

## 2025-01-01 NOTE — ED Provider Triage Note (Signed)
 Emergency Medicine Provider Triage Evaluation Note  Monica Dillon , a 59 y.o. female  was evaluated in triage.  Pt complains of increasing pain for the past 2 to 3 days.  Notes issues with pancreatitis for the past 2 weeks but pain has continued to get worse.  Worse than at the ER visit at [facility] last week.  Has not been able to eat or drink for 2 days due to vomiting.  Notes had MRI earlier today.  Has not taken any pain meds today.  Denies fevers.  Review of Systems  Positive: Abdominal pain, nausea/vomiting Negative: Headache, dizziness, syncope, chest pain, shortness of breath, urinary symptoms  Physical Exam  BP 123/82 (BP Location: Right Arm)   Pulse 88   Temp 98 F (36.7 C)   Resp 16   Ht 5' (1.524 m)   Wt 53.1 kg   LMP 01/08/2017   SpO2 97%   BMI 22.85 kg/m  Gen:   Awake, no distress   Resp:  Normal effort  MSK:   Moves extremities without difficulty  Other:    Medical Decision Making  Medically screening exam initiated at 1:48 PM.  Appropriate orders placed.  Kenndra Morris was informed that the remainder of the evaluation will be completed by another provider, this initial triage assessment does not replace that evaluation, and the importance of remaining in the ED until their evaluation is complete.     Neysa Thersia RAMAN, [place] 01/01/25 1349

## 2025-01-01 NOTE — H&P (Signed)
 " History and Physical   Monica Dillon FMW:[id number] DOB: Jul 04, 1966 DOA: 01/01/2025  PCP: Domenica Harlene LABOR, MD   Patient coming from: Home  Chief Complaint: Abdominal pain, ongoing pancreatitis  HPI: Monica Dillon is a 59 y.o. female with medical history significant of hyperlipidemia, GERD, chronic pancreatitis, anxiety, depression, asthma presenting with persistent abdominal pain  Patient has had symptoms on and off for the past 10 days or so.  Seen in the ED on 1/6 for abdominal pain found to have acute pancreatitis with mildly elevated lipase patient discussed with GI recommended outpatient/conservative treatment as patient was not needing significant pain medication.  Was prescribed Zofran .  Return 2 days later with persistent/worsening abdominal pain labs were again reassuring and patient was prescribed further Zofran  and was at this time prescribed pain medication as her pain did improve with p.o. pain medication in the ED.  Patient has since followed up with her PCP and has had new prescription for further pain medication and additional labs and imaging were obtained.  MRI ordered due to pancreatic pseudocyst seen on CT in the ED with recommendation for follow-up MRI.  Patient has had persistent/worsening pain despite all this and now developing nausea and vomiting.  MRI done today did show pancreatic pseudocyst with some mild to moderate mass effect on the duodenum and pancreas.  Lipase was significantly elevated at 1029 yesterday.  Patient denies fevers, chills, chest pain, constipation, diarrhea.  ED Course: Vital signs in the ED stable.  Lab workup included CMP with chloride 95, glucose 110.  CBC with leukocytosis to 11.8.  Lipase 421.  Urinalysis pending.  As above, MRI today showed heterogeneous mass between the pancreatic head and duodenum.  With mild to moderate mass effect.  Consistent with pseudocyst versus lymphadenopathy though recent pancreatitis increases likelihood of  pseudocyst.  Mild to moderate wall thickening of the ileum without fat stranding could represent peristalsis versus nonspecific enteritis.  Patient received pain medication, Zofran , 1 L IV fluids in the ED.  Review of Systems: As per HPI otherwise all other systems reviewed and are negative.  Past Medical History:  Diagnosis Date   Allergy    Anxiety and depression 02/06/2016   Cervical cancer screening 10/12/2016   Menarche at 12 Regular and moderate flow one history of abnormal pap in past at age 68, ASGUS, frozen then testing normal G3P**, s/p 3 SVA No history of abnormal MGM Noconcerns today Cryotherapy and tubal: gyn surgeries   GERD (gastroesophageal reflux disease)    History of chicken pox    Hx of pancreatitis    Hyperlipidemia 01/08/2017   Pancreatic mass 12/23/2024   CT scan   Pancreatitis    Tobacco abuse disorder 10/12/2016   Viral respiratory illness 01/14/2017    Past Surgical History:  Procedure Laterality Date   TUBAL LIGATION  about 20 yrs ago    Social History  reports that she has been smoking cigarettes. She has a 30 pack-year smoking history. She has never used smokeless tobacco. She reports current alcohol use. She reports that she does not use drugs.  Allergies[1]  Family History  Problem Relation Age of Onset   Diverticulitis Mother    Drug abuse Father    Cancer Father        colon cancer   Cirrhosis Sister    Alcohol abuse Sister    Diabetes Maternal Grandmother    Cancer Daughter        skin   Aneurysm Maternal Aunt  brain  Reviewed on admission  Prior to Admission medications  Medication Sig Start Date End Date Taking? Authorizing Provider  diazepam  (VALIUM ) 5 MG tablet Take 5 mg by mouth daily as needed. 12/30/24  Yes [provider]  Fluticasone-Umeclidin-Vilant (TRELEGY ELLIPTA ) 200-62.5-25 MCG/ACT AEPB Inhale 1 puff into the lungs daily. 05/01/24  Yes Hunsucker, Donnice SAUNDERS, MD  ondansetron  (ZOFRAN -ODT) 8 MG  disintegrating tablet Take 1 tablet (8 mg total) by mouth every 8 (eight) hours as needed for nausea or vomiting. 12/25/24  Yes Raford Lenis, MD  oxyCODONE -acetaminophen  (PERCOCET/ROXICET) 5-325 MG tablet Take 1-2 tablets by mouth every 6 (six) hours as needed for severe pain (pain score 7-10). 12/25/24  Yes Raford Lenis, MD  acetaminophen  (TYLENOL ) 325 MG tablet Take 650 mg by mouth every 6 (six) hours as needed for headache. Patient not taking: Reported on 01/01/2025    [provider]  ondansetron  (ZOFRAN ) 4 MG tablet Take 1 tablet (4 mg total) by mouth every 6 (six) hours. Patient not taking: Reported on 01/01/2025 12/23/24   Myriam Dorn BROCKS, [place]    Physical Exam: Vitals:   01/01/25 1301 01/01/25 1335  BP: 123/82   Pulse: 88   Resp: 16   Temp: 98 F (36.7 C)   SpO2: 97%   Weight:  53.1 kg  Height:  5' (1.524 m)    Physical Exam Constitutional:      General: She is not in acute distress.    Appearance: Normal appearance.  HENT:     Head: Normocephalic and atraumatic.     Mouth/Throat:     Mouth: Mucous membranes are moist.     Pharynx: Oropharynx is clear.  Eyes:     Extraocular Movements: Extraocular movements intact.     Pupils: Pupils are equal, round, and reactive to light.  Cardiovascular:     Rate and Rhythm: Normal rate and regular rhythm.     Pulses: Normal pulses.     Heart sounds: Normal heart sounds.  Pulmonary:     Effort: Pulmonary effort is normal. No respiratory distress.     Breath sounds: Normal breath sounds.  Abdominal:     General: Bowel sounds are normal. There is no distension.     Palpations: Abdomen is soft.     Tenderness: There is abdominal tenderness.  Musculoskeletal:        General: No swelling or deformity.  Skin:    General: Skin is warm and dry.  Neurological:     General: No focal deficit present.     Mental Status: Mental status is at baseline.    Labs on Admission: I have personally reviewed following labs and imaging  studies  CBC: Recent Labs  Lab 12/26/24 1545 12/30/24 1048 01/01/25 1341  WBC 12.2* 12.6* 11.8*  HGB 13.8 14.0 14.7  HCT 40.3 39.8 42.2  MCV 90.2 88.8 90.8  PLT 376 395.0 395    Basic Metabolic Panel: Recent Labs  Lab 12/26/24 1545 12/30/24 1048 01/01/25 1341  NA 138 137 136  K 3.4* 3.2* 3.5  CL 99 96 95*  CO2 30 32 30  GLUCOSE 88 71 110*  BUN 6* 6 7  CREATININE 0.62 0.65 0.64  CALCIUM  9.7 9.4 9.4    GFR: Estimated Creatinine Clearance: 55.1 mL/min (by C-G formula based on SCr of 0.64 mg/dL).  Liver Function Tests: Recent Labs  Lab 12/26/24 1545 12/30/24 1048 01/01/25 1341  AST 13  13 11 15   ALT 11  11 9 10   ALKPHOS  --  58 69  BILITOT 0.3  0.3 0.4 0.4  PROT 7.3  7.3 7.1 7.7  ALBUMIN  --  4.3 4.2    Urine analysis:    Component Value Date/Time   COLORURINE YELLOW 12/25/2024 0632   APPEARANCEUR CLEAR 12/25/2024 0632   LABSPEC 1.005 12/25/2024 0632   PHURINE 5.0 12/25/2024 0632   GLUCOSEU NEGATIVE 12/25/2024 0632   HGBUR MODERATE (A) 12/25/2024 0632   BILIRUBINUR NEGATIVE 12/26/2024 1548   KETONESUR NEGATIVE 12/25/2024 0632   PROTEINUR Negative 12/26/2024 1548   PROTEINUR NEGATIVE 12/25/2024 0632   UROBILINOGEN 0.2 12/26/2024 1548   NITRITE NEGATIVE 12/26/2024 1548   NITRITE NEGATIVE 12/25/2024 0632   LEUKOCYTESUR Negative 12/26/2024 1548   LEUKOCYTESUR NEGATIVE 12/25/2024 9367    Radiological Exams on Admission: MR Abdomen W Wo Contrast Result Date: 01/01/2025 CLINICAL DATA:  Crohn's suspected; Crohn's suspected crohn's suspected. EXAM: MRI ABDOMEN WITHOUT AND WITH CONTRAST TECHNIQUE: Multiplanar multisequence MR imaging of the abdomen was performed both before and after the administration of intravenous contrast. CONTRAST:  5mL GADAVIST  GADOBUTROL  1 MMOL/ML IV SOLN COMPARISON:  CT scan abdomen and pelvis from 12/23/2024. FINDINGS: Lower chest: Unremarkable MR appearance to the lung bases. No pleural effusion. No pericardial effusion. Normal  heart size. Hepatobiliary: The liver is normal in size. Noncirrhotic configuration. There is a single sub 5 mm cyst in the right hepatic dome. No suspicious liver lesion. No intrahepatic or extrahepatic bile duct dilatation. No choledocholithiasis. Unremarkable gallbladder. Pancreas: T1 hyperintense signal intensity of the pancreas. No peripancreatic fat stranding. There is a mildly T2 hyperintense, heterogeneous, approximately 2.6 x 3.5 cm lesion centered adjacent to the pancreatic head however, small portion extending into the pancreatic head. The lesion exhibits thick irregular enhancing walls and central nonenhancing areas. The lesion exerts mild-to-moderate external mass effect over the proximal duodenum as well as main pancreatic duct/extrahepatic bile ducts. No other focal pancreatic lesion. The main pancreatic duct is slightly dilated measuring up to 4 mm. Spleen:  Within normal limits in size and appearance. No focal mass. Adrenals/Urinary Tract: Unremarkable adrenal glands. No hydroureteronephrosis. There is a sub 5 mm simple cyst arising from the right kidney interpolar region, laterally. No suspicious renal mass on either side. Stomach/Bowel: No disproportionate dilation of small or large bowel loops. Unremarkable appendix. There is a heterogeneous mass centered between the pancreatic head and proximal duodenum, as discussed above. No other focal enteric lesion seen. There is mild-to-moderate circumferential wall thickening of the terminal ileum (series 3, image 27) without perienteric fat stranding and mucosal hyperenhancement. Findings are nonspecific and may be due to transient peristalsis/contraction versus chronic enteritis. Correlate clinically to determine the need for additional imaging with CT enterography or MR enterography. Vascular/Lymphatic: No pathologically enlarged lymph nodes identified. No abdominal aortic aneurysm demonstrated. No ascites. Other:  None. Musculoskeletal: No suspicious  bone lesions identified. A hemangioma is noted in the T12 vertebral body. IMPRESSION: 1. There is a heterogeneous mass centered between the pancreatic head and proximal duodenum, as described above. The lesion exhibits thick irregular enhancing walls and central nonenhancing areas. The lesion exerts mild-to-moderate external mass effect over the proximal duodenum as well as main pancreatic duct/extrahepatic bile ducts. No other focal pancreatic lesion. The main pancreatic duct is slightly dilated measuring up to 4 mm. Findings are nonspecific but differential diagnosis includes pancreatic pseudocyst (especially if history of recent pancreatitis), metastatic lymphadenopathy, etc. 2. There is mild-to-moderate circumferential wall thickening of the terminal ileum without perienteric fat stranding and mucosal hyperenhancement. Findings are nonspecific and may  be due to transient peristalsis/contraction versus chronic enteritis. Correlate clinically to determine the need for additional imaging with CT enterography or MR enterography. 3. Multiple other nonacute observations, as described above. Electronically Signed   By: Ree Molt M.D.   On: 01/01/2025 13:40   MR 3D Recon At Scanner Result Date: 01/01/2025 CLINICAL DATA:  Crohn's suspected; Crohn's suspected crohn's suspected. EXAM: MRI ABDOMEN WITHOUT AND WITH CONTRAST TECHNIQUE: Multiplanar multisequence MR imaging of the abdomen was performed both before and after the administration of intravenous contrast. CONTRAST:  5mL GADAVIST  GADOBUTROL  1 MMOL/ML IV SOLN COMPARISON:  CT scan abdomen and pelvis from 12/23/2024. FINDINGS: Lower chest: Unremarkable MR appearance to the lung bases. No pleural effusion. No pericardial effusion. Normal heart size. Hepatobiliary: The liver is normal in size. Noncirrhotic configuration. There is a single sub 5 mm cyst in the right hepatic dome. No suspicious liver lesion. No intrahepatic or extrahepatic bile duct dilatation. No  choledocholithiasis. Unremarkable gallbladder. Pancreas: T1 hyperintense signal intensity of the pancreas. No peripancreatic fat stranding. There is a mildly T2 hyperintense, heterogeneous, approximately 2.6 x 3.5 cm lesion centered adjacent to the pancreatic head however, small portion extending into the pancreatic head. The lesion exhibits thick irregular enhancing walls and central nonenhancing areas. The lesion exerts mild-to-moderate external mass effect over the proximal duodenum as well as main pancreatic duct/extrahepatic bile ducts. No other focal pancreatic lesion. The main pancreatic duct is slightly dilated measuring up to 4 mm. Spleen:  Within normal limits in size and appearance. No focal mass. Adrenals/Urinary Tract: Unremarkable adrenal glands. No hydroureteronephrosis. There is a sub 5 mm simple cyst arising from the right kidney interpolar region, laterally. No suspicious renal mass on either side. Stomach/Bowel: No disproportionate dilation of small or large bowel loops. Unremarkable appendix. There is a heterogeneous mass centered between the pancreatic head and proximal duodenum, as discussed above. No other focal enteric lesion seen. There is mild-to-moderate circumferential wall thickening of the terminal ileum (series 3, image 27) without perienteric fat stranding and mucosal hyperenhancement. Findings are nonspecific and may be due to transient peristalsis/contraction versus chronic enteritis. Correlate clinically to determine the need for additional imaging with CT enterography or MR enterography. Vascular/Lymphatic: No pathologically enlarged lymph nodes identified. No abdominal aortic aneurysm demonstrated. No ascites. Other:  None. Musculoskeletal: No suspicious bone lesions identified. A hemangioma is noted in the T12 vertebral body. IMPRESSION: 1. There is a heterogeneous mass centered between the pancreatic head and proximal duodenum, as described above. The lesion exhibits thick  irregular enhancing walls and central nonenhancing areas. The lesion exerts mild-to-moderate external mass effect over the proximal duodenum as well as main pancreatic duct/extrahepatic bile ducts. No other focal pancreatic lesion. The main pancreatic duct is slightly dilated measuring up to 4 mm. Findings are nonspecific but differential diagnosis includes pancreatic pseudocyst (especially if history of recent pancreatitis), metastatic lymphadenopathy, etc. 2. There is mild-to-moderate circumferential wall thickening of the terminal ileum without perienteric fat stranding and mucosal hyperenhancement. Findings are nonspecific and may be due to transient peristalsis/contraction versus chronic enteritis. Correlate clinically to determine the need for additional imaging with CT enterography or MR enterography. 3. Multiple other nonacute observations, as described above. Electronically Signed   By: Ree Molt M.D.   On: 01/01/2025 13:40   EKG: Independently reviewed.  Sinus rhythm at 75 bpm.  Assessment/Plan Principal Problem:   Acute on chronic pancreatitis (HCC) Active Problems:   GERD (gastroesophageal reflux disease)   Anxiety and depression   Hyperlipidemia  Asthma   Acute on chronic pancreatitis > Ongoing issues with pancreatitis for the past 10 days with recurrent flare now in the setting of pancreatic pseudocyst with may be having mass effect on her pancreatic duct. > Initial lipase's were in the 100s repeat yesterday was greater than thousand and today is greater than 400. > Persistent/worsening pain despite outpatient treatment and now developing nausea and vomiting. > Previous CT and recent MRI with evidence of pancreatic pseudocyst with some mild to moderate mass effect on duodenum and pancreas. > Mild leukocytosis, likely reactive, if persistent consider infected pseudocyst.  - Monitor on telemetry overnight - N.p.o. except sips with meds and ice chips - IV fluids - As needed  pain medication - Supportive care  Anxiety/depression - Continue as needed Valium   Asthma - Replace home Trelegy with formulary Breztri   DVT prophylaxis: Lovenox  Code Status:   Full Family Communication:  Updated at bedside Disposition Plan:   Patient is from:  Home  Anticipated DC to:  Home  Anticipated DC date:  1 to 3 days  Anticipated DC barriers: None  Consults called:  None Admission status:  Observation, telemetry  Severity of Illness: The appropriate patient status for this patient is OBSERVATION. Observation status is judged to be reasonable and necessary in order to provide the required intensity of service to ensure the patient's safety. The patient's presenting symptoms, physical exam findings, and initial radiographic and laboratory data in the context of their medical condition is felt to place them at decreased risk for further clinical deterioration. Furthermore, it is anticipated that the patient will be medically stable for discharge from the hospital within 2 midnights of admission.    Marsa KATHEE Scurry MD [facility]  How to contact the [facility] Attending or Consulting provider 7A - 7P or covering provider during after hours 7P -7A, for this patient?   Check the care team in [facility] and look for a) attending/consulting [facility] provider listed and b) the [facility] team listed Log into [email or web address] and use [facility]'s universal password to access. If you do not have the password, please contact the hospital operator. Locate the [facility] provider you are looking for under [facility] and page to a number that you can be directly reached. If you still have difficulty reaching the provider, please page the [facility] (Director on Call) for the Hospitalists listed on amion for assistance.  01/01/2025, 5:06 PM       [1]  Allergies Allergen Reactions   Lipitor [Atorvastatin ]     Severe leg weakness   "

## 2025-01-01 NOTE — ED Provider Notes (Signed)
 " [place] [facility] Provider Note   CSN: [id number] Arrival date & time: 01/01/25  1258     Patient presents with: Abdominal Pain   Monica Dillon is a 59 y.o. female who presents today for evaluation of worsening abdominal pain and vomiting in the setting of known pancreatitis.  Patient was initially diagnosed with pancreatitis about a week ago.  Patient had a CT scan at that time that showed evidence of acute pancreatitis however no other complications.  He was sent home with symptomatic management.  Monica Dillon presented on 1/8 for worsening symptoms with stable vital signs and stable laboratory evaluation.  Patient has since followed up with outpatient GI and underwent repeat laboratory evaluation in the outpatient setting that showed a significant elevation in lipase up to 1029.  Patient reports that in the interim she has had worsening abdominal pain despite outpatient oral narcotic pain management.  Has now also developed breakthrough emesis despite Zofran  for symptom relief.  Otherwise denies any fevers or chills.  Due to persistent symptoms is presented today for further evaluation.       Prior to Admission medications  Medication Sig Start Date End Date Taking? Authorizing Provider  acetaminophen  (TYLENOL ) 325 MG tablet Take 650 mg by mouth every 6 (six) hours as needed for headache.    [provider]  Fluticasone-Umeclidin-Vilant (TRELEGY ELLIPTA ) 200-62.5-25 MCG/ACT AEPB Inhale 1 puff into the lungs daily. 05/01/24   Hunsucker, Donnice SAUNDERS, MD  ondansetron  (ZOFRAN ) 4 MG tablet Take 1 tablet (4 mg total) by mouth every 6 (six) hours. 12/23/24   Myriam Dorn BROCKS, PA  ondansetron  (ZOFRAN -ODT) 8 MG disintegrating tablet Take 1 tablet (8 mg total) by mouth every 8 (eight) hours as needed for nausea or vomiting. 12/25/24   Raford Lenis, MD  oxyCODONE -acetaminophen  (PERCOCET/ROXICET) 5-325 MG tablet Take 1-2 tablets by mouth every 6 (six) hours as needed  for severe pain (pain score 7-10). 12/25/24   Raford Lenis, MD    Allergies: Lipitor [atorvastatin ]    Review of Systems  Gastrointestinal:  Positive for abdominal pain.    Updated Vital Signs BP 123/82 (BP Location: Right Arm)   Pulse 88   Temp 98 F (36.7 C)   Resp 16   Ht 5' (1.524 m)   Wt 53.1 kg   LMP 01/08/2017   SpO2 97%   BMI 22.85 kg/m   Physical Exam Constitutional:      General: She is not in acute distress.    Appearance: She is well-developed.  HENT:     Head: Normocephalic.     Mouth/Throat:     Mouth: Mucous membranes are moist.  Eyes:     Extraocular Movements: Extraocular movements intact.  Cardiovascular:     Rate and Rhythm: Normal rate and regular rhythm.  Pulmonary:     Effort: Pulmonary effort is normal.  Abdominal:     General: Abdomen is flat. Bowel sounds are normal.     Palpations: Abdomen is soft.     Tenderness: There is abdominal tenderness in the epigastric area.  Skin:    General: Skin is warm.     Capillary Refill: Capillary refill takes less than 2 seconds.  Neurological:     General: No focal deficit present.     Mental Status: She is alert.  Psychiatric:        Mood and Affect: Mood normal.     (all labs ordered are listed, but only abnormal results are displayed) Labs Reviewed  LIPASE, BLOOD - Abnormal; Notable for the following components:      Result Value   Lipase 421 (*)    All other components within normal limits  COMPREHENSIVE METABOLIC PANEL WITH GFR - Abnormal; Notable for the following components:   Chloride 95 (*)    Glucose, Bld 110 (*)    All other components within normal limits  CBC - Abnormal; Notable for the following components:   WBC 11.8 (*)    All other components within normal limits  URINALYSIS, ROUTINE W REFLEX MICROSCOPIC    EKG: None  Radiology: MR Abdomen W Wo Contrast Result Date: 01/01/2025 CLINICAL DATA:  Crohn's suspected; Crohn's suspected crohn's suspected. EXAM: MRI ABDOMEN  WITHOUT AND WITH CONTRAST TECHNIQUE: Multiplanar multisequence MR imaging of the abdomen was performed both before and after the administration of intravenous contrast. CONTRAST:  5mL GADAVIST  GADOBUTROL  1 MMOL/ML IV SOLN COMPARISON:  CT scan abdomen and pelvis from 12/23/2024. FINDINGS: Lower chest: Unremarkable MR appearance to the lung bases. No pleural effusion. No pericardial effusion. Normal heart size. Hepatobiliary: The liver is normal in size. Noncirrhotic configuration. There is a single sub 5 mm cyst in the right hepatic dome. No suspicious liver lesion. No intrahepatic or extrahepatic bile duct dilatation. No choledocholithiasis. Unremarkable gallbladder. Pancreas: T1 hyperintense signal intensity of the pancreas. No peripancreatic fat stranding. There is a mildly T2 hyperintense, heterogeneous, approximately 2.6 x 3.5 cm lesion centered adjacent to the pancreatic head however, small portion extending into the pancreatic head. The lesion exhibits thick irregular enhancing walls and central nonenhancing areas. The lesion exerts mild-to-moderate external mass effect over the proximal duodenum as well as main pancreatic duct/extrahepatic bile ducts. No other focal pancreatic lesion. The main pancreatic duct is slightly dilated measuring up to 4 mm. Spleen:  Within normal limits in size and appearance. No focal mass. Adrenals/Urinary Tract: Unremarkable adrenal glands. No hydroureteronephrosis. There is a sub 5 mm simple cyst arising from the right kidney interpolar region, laterally. No suspicious renal mass on either side. Stomach/Bowel: No disproportionate dilation of small or large bowel loops. Unremarkable appendix. There is a heterogeneous mass centered between the pancreatic head and proximal duodenum, as discussed above. No other focal enteric lesion seen. There is mild-to-moderate circumferential wall thickening of the terminal ileum (series 3, image 27) without perienteric fat stranding and mucosal  hyperenhancement. Findings are nonspecific and may be due to transient peristalsis/contraction versus chronic enteritis. Correlate clinically to determine the need for additional imaging with CT enterography or MR enterography. Vascular/Lymphatic: No pathologically enlarged lymph nodes identified. No abdominal aortic aneurysm demonstrated. No ascites. Other:  None. Musculoskeletal: No suspicious bone lesions identified. A hemangioma is noted in the T12 vertebral body. IMPRESSION: 1. There is a heterogeneous mass centered between the pancreatic head and proximal duodenum, as described above. The lesion exhibits thick irregular enhancing walls and central nonenhancing areas. The lesion exerts mild-to-moderate external mass effect over the proximal duodenum as well as main pancreatic duct/extrahepatic bile ducts. No other focal pancreatic lesion. The main pancreatic duct is slightly dilated measuring up to 4 mm. Findings are nonspecific but differential diagnosis includes pancreatic pseudocyst (especially if history of recent pancreatitis), metastatic lymphadenopathy, etc. 2. There is mild-to-moderate circumferential wall thickening of the terminal ileum without perienteric fat stranding and mucosal hyperenhancement. Findings are nonspecific and may be due to transient peristalsis/contraction versus chronic enteritis. Correlate clinically to determine the need for additional imaging with CT enterography or MR enterography. 3. Multiple other nonacute observations, as described above. Electronically  Signed   By: Ree Molt M.D.   On: 01/01/2025 13:40   MR 3D Recon At Scanner Result Date: 01/01/2025 CLINICAL DATA:  Crohn's suspected; Crohn's suspected crohn's suspected. EXAM: MRI ABDOMEN WITHOUT AND WITH CONTRAST TECHNIQUE: Multiplanar multisequence MR imaging of the abdomen was performed both before and after the administration of intravenous contrast. CONTRAST:  5mL GADAVIST  GADOBUTROL  1 MMOL/ML IV SOLN  COMPARISON:  CT scan abdomen and pelvis from 12/23/2024. FINDINGS: Lower chest: Unremarkable MR appearance to the lung bases. No pleural effusion. No pericardial effusion. Normal heart size. Hepatobiliary: The liver is normal in size. Noncirrhotic configuration. There is a single sub 5 mm cyst in the right hepatic dome. No suspicious liver lesion. No intrahepatic or extrahepatic bile duct dilatation. No choledocholithiasis. Unremarkable gallbladder. Pancreas: T1 hyperintense signal intensity of the pancreas. No peripancreatic fat stranding. There is a mildly T2 hyperintense, heterogeneous, approximately 2.6 x 3.5 cm lesion centered adjacent to the pancreatic head however, small portion extending into the pancreatic head. The lesion exhibits thick irregular enhancing walls and central nonenhancing areas. The lesion exerts mild-to-moderate external mass effect over the proximal duodenum as well as main pancreatic duct/extrahepatic bile ducts. No other focal pancreatic lesion. The main pancreatic duct is slightly dilated measuring up to 4 mm. Spleen:  Within normal limits in size and appearance. No focal mass. Adrenals/Urinary Tract: Unremarkable adrenal glands. No hydroureteronephrosis. There is a sub 5 mm simple cyst arising from the right kidney interpolar region, laterally. No suspicious renal mass on either side. Stomach/Bowel: No disproportionate dilation of small or large bowel loops. Unremarkable appendix. There is a heterogeneous mass centered between the pancreatic head and proximal duodenum, as discussed above. No other focal enteric lesion seen. There is mild-to-moderate circumferential wall thickening of the terminal ileum (series 3, image 27) without perienteric fat stranding and mucosal hyperenhancement. Findings are nonspecific and may be due to transient peristalsis/contraction versus chronic enteritis. Correlate clinically to determine the need for additional imaging with CT enterography or MR  enterography. Vascular/Lymphatic: No pathologically enlarged lymph nodes identified. No abdominal aortic aneurysm demonstrated. No ascites. Other:  None. Musculoskeletal: No suspicious bone lesions identified. A hemangioma is noted in the T12 vertebral body. IMPRESSION: 1. There is a heterogeneous mass centered between the pancreatic head and proximal duodenum, as described above. The lesion exhibits thick irregular enhancing walls and central nonenhancing areas. The lesion exerts mild-to-moderate external mass effect over the proximal duodenum as well as main pancreatic duct/extrahepatic bile ducts. No other focal pancreatic lesion. The main pancreatic duct is slightly dilated measuring up to 4 mm. Findings are nonspecific but differential diagnosis includes pancreatic pseudocyst (especially if history of recent pancreatitis), metastatic lymphadenopathy, etc. 2. There is mild-to-moderate circumferential wall thickening of the terminal ileum without perienteric fat stranding and mucosal hyperenhancement. Findings are nonspecific and may be due to transient peristalsis/contraction versus chronic enteritis. Correlate clinically to determine the need for additional imaging with CT enterography or MR enterography. 3. Multiple other nonacute observations, as described above. Electronically Signed   By: Ree Molt M.D.   On: 01/01/2025 13:40    Medications Ordered in the ED  lactated ringers  bolus 1,000 mL (has no administration in time range)  ondansetron  (ZOFRAN -ODT) disintegrating tablet 4 mg (4 mg Oral Given 01/01/25 1358)  oxyCODONE -acetaminophen  (PERCOCET/ROXICET) 5-325 MG per tablet 1 tablet (1 tablet Oral Given 01/01/25 1358)     Medical Decision Making Amount and/or Complexity of Data Reviewed Labs: ordered.  Risk Decision regarding hospitalization.   Patient is  a 59 year old female who comes today for evaluation of worsening abdominal pain in the setting of known pancreatitis.  On initial  assessment patient was noted to be hemodynamically stable and afebrile.  On my bedside assessment patient was resting comfortably without acute distress.  Physical examination notable for epigastric abdominal tenderness without overlying skin changes.  No additional abnormalities.  Patient had laboratory evaluation that was completed in the triage process at the time my assessment.  Patient did have mild leukocytosis to 11.8 that was slightly downtrending from prior laboratory evaluation.  Patient's metabolic panel without evidence of transaminitis or hyperbilirubinemia.  Lipase now downtrending to 421.  On my chart review patient already had a MRI of the abdomen pelvis that was completed in the outpatient setting today that did show evidence of a new pancreatic head mass that does exert some mass effect over the proximal duodenum and main pancreatic duct with considerations for potential pancreatic pseudocyst versus metastatic lymphadenopathy.  On patient's prior CT imaging did have evidence of pancreatic pseudocyst.  Given patient's persistent symptoms despite outpatient management, will need admission at this time for further symptomatic control.  Patient was provided analgesia as well as IV fluids here in the emergency department.  No further laboratory evaluation or repeat imaging indicated at this time.    Final diagnoses:  Acute pancreatitis without infection or necrosis, unspecified pancreatitis type    ED Discharge Orders     None          Laurita Sieving, MD 01/01/25 [id number]    Garrick Charleston, MD 01/02/25 [phone]  "

## 2025-01-01 NOTE — ED Notes (Signed)
 Attempted IV on left FA after right AC infiltration. Unsuccessful attempt, IV team consult ordered.

## 2025-01-01 NOTE — ED Triage Notes (Signed)
 Patient presents with abdominal pain and nausea and vomiting, seen at [facility] a week ago for same, no resolution since then.

## 2025-01-01 NOTE — ED Triage Notes (Signed)
 Pt to er, pt states that she has had pancreatitis for the past 10 ish days.  States that her md ordered at ct, ultrasound and an MRI today, states that she is here because this pain is unmanageable.

## 2025-01-02 DIAGNOSIS — K219 Gastro-esophageal reflux disease without esophagitis: Secondary | ICD-10-CM | POA: Diagnosis not present

## 2025-01-02 DIAGNOSIS — K861 Other chronic pancreatitis: Secondary | ICD-10-CM | POA: Diagnosis not present

## 2025-01-02 DIAGNOSIS — R933 Abnormal findings on diagnostic imaging of other parts of digestive tract: Secondary | ICD-10-CM | POA: Diagnosis not present

## 2025-01-02 DIAGNOSIS — E782 Mixed hyperlipidemia: Secondary | ICD-10-CM | POA: Diagnosis not present

## 2025-01-02 DIAGNOSIS — J45909 Unspecified asthma, uncomplicated: Secondary | ICD-10-CM | POA: Diagnosis not present

## 2025-01-02 DIAGNOSIS — F419 Anxiety disorder, unspecified: Secondary | ICD-10-CM | POA: Diagnosis not present

## 2025-01-02 DIAGNOSIS — E876 Hypokalemia: Secondary | ICD-10-CM

## 2025-01-02 DIAGNOSIS — K859 Acute pancreatitis without necrosis or infection, unspecified: Secondary | ICD-10-CM | POA: Diagnosis not present

## 2025-01-02 DIAGNOSIS — F32A Depression, unspecified: Secondary | ICD-10-CM | POA: Diagnosis not present

## 2025-01-02 LAB — LIPID PANEL
Cholesterol: 146 mg/dL (ref 0–200)
HDL: 25 mg/dL — ABNORMAL LOW
LDL Cholesterol: 99 mg/dL (ref 0–99)
Total CHOL/HDL Ratio: 5.9 ratio
Triglycerides: 109 mg/dL
VLDL: 22 mg/dL (ref 0–40)

## 2025-01-02 LAB — COMPREHENSIVE METABOLIC PANEL WITH GFR
ALT: 7 U/L (ref 0–44)
AST: 12 U/L — ABNORMAL LOW (ref 15–41)
Albumin: 3.5 g/dL (ref 3.5–5.0)
Alkaline Phosphatase: 58 U/L (ref 38–126)
Anion gap: 11 (ref 5–15)
BUN: 8 mg/dL (ref 6–20)
CO2: 28 mmol/L (ref 22–32)
Calcium: 8.8 mg/dL — ABNORMAL LOW (ref 8.9–10.3)
Chloride: 96 mmol/L — ABNORMAL LOW (ref 98–111)
Creatinine, Ser: 0.61 mg/dL (ref 0.44–1.00)
GFR, Estimated: >60 mL/min
Glucose, Bld: 94 mg/dL (ref 70–99)
Potassium: 3.2 mmol/L — ABNORMAL LOW (ref 3.5–5.1)
Sodium: 135 mmol/L (ref 135–145)
Total Bilirubin: 0.4 mg/dL (ref 0.0–1.2)
Total Protein: 6.4 g/dL — ABNORMAL LOW (ref 6.5–8.1)

## 2025-01-02 LAB — HIV ANTIBODY (ROUTINE TESTING W REFLEX): HIV Screen 4th Generation wRfx: NONREACTIVE

## 2025-01-02 LAB — CBC
HCT: 35.5 % — ABNORMAL LOW (ref 36.0–46.0)
Hemoglobin: 12.2 g/dL (ref 12.0–15.0)
MCH: 31.3 pg (ref 26.0–34.0)
MCHC: 34.4 g/dL (ref 30.0–36.0)
MCV: 91 fL (ref 80.0–100.0)
Platelets: 324 K/uL (ref 150–400)
RBC: 3.9 MIL/uL (ref 3.87–5.11)
RDW: 11.7 % (ref 11.5–15.5)
WBC: 10.8 K/uL — ABNORMAL HIGH (ref 4.0–10.5)
nRBC: 0 % (ref 0.0–0.2)

## 2025-01-02 MED ORDER — METOCLOPRAMIDE HCL 5 MG/ML IJ SOLN
5.0000 mg | Freq: Three times a day (TID) | INTRAMUSCULAR | Status: DC
  Administered 2025-01-02 – 2025-01-04 (×5): 5 mg via INTRAVENOUS
  Filled 2025-01-02 (×5): qty 2

## 2025-01-02 MED ORDER — POTASSIUM CHLORIDE 20 MEQ PO PACK
40.0000 meq | PACK | Freq: Two times a day (BID) | ORAL | Status: AC
  Administered 2025-01-02 (×2): 40 meq via ORAL
  Filled 2025-01-02 (×2): qty 2
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
## 2025-01-02 NOTE — Progress Notes (Addendum)
 [facility]  PROGRESS NOTE  Monica Dillon FMW:[id number] DOB: Apr 22, 1966 DOA: 01/01/2025 PCP: Domenica Harlene LABOR, MD   Brief HPI:    59 y.o. female with medical history significant of hyperlipidemia, GERD, chronic pancreatitis, anxiety, depression, asthma presenting with persistent abdominal pain   Patient has had symptoms on and off for the past 10 days or so.  Seen in the ED on 1/6 for abdominal pain found to have acute pancreatitis with mildly elevated lipase patient discussed with GI recommended outpatient/conservative treatment as patient was not needing significant pain medication.  Was prescribed Zofran .  Return 2 days later with persistent/worsening abdominal pain labs were again reassuring and patient was prescribed further Zofran  and was at this time prescribed pain medication as her pain did improve with p.o. pain medication in the ED.   Patient has since followed up with her PCP and has had new prescription for further pain medication and additional labs and imaging were obtained.  MRI ordered due to pancreatic pseudocyst seen on CT in the ED with recommendation for follow-up MRI.     Assessment/Plan:   Acute on chronic pancreatitis/pancreatic mass -Patient presented with abdominal pain, patient abdominal ultrasound for pancreatic mass -Patient was supposed to follow-up with GI as outpatient -Came to hospital with worsening abdominal pain, MRI abdomen ordered as she had a CT of the abdomen on 12/23/2024 which showed pancreatic cyst -MRI abdomen showed heterogeneous mass centered between the pancreatic head and proximal duodenum.  Lesion exhibits thick irregular enhancing walls and central nonenhancing areas. -Likely endoscopic ultrasound/biopsy of the pancreatic mass -Will consult gastroenterology  Anxiety/depression - Continue as needed Valium    Asthma - Replace home Trelegy with formulary Breztri   Hypokalemia -Potassium is 3.2 -Replace potassium and follow BMP in am         DVT prophylaxis: Lovenox   Medications     budesonide -glycopyrrolate -formoterol   2 puff Inhalation BID   enoxaparin  (LOVENOX ) injection  40 mg Subcutaneous Q24H   sodium chloride  flush  3 mL Intravenous Q12H     Data Reviewed:   CBG:  No results for input(s): GLUCAP in the last 168 hours.  SpO2: 98 %    Vitals:   01/01/25 2201 01/02/25 0025 01/02/25 0450 01/02/25 0810  BP:  (!) 107/59 (!) 111/58 124/62  Pulse:  86 79 73  Resp:  20 20 18   Temp:  98.6 F (37 C) 98.1 F (36.7 C) (!) 97.5 F (36.4 C)  TempSrc:  Oral Oral Oral  SpO2: 97% 92% 95% 98%  Weight:      Height:          Data Reviewed:  Basic Metabolic Panel: Recent Labs  Lab 12/26/24 1545 12/30/24 1048 01/01/25 1341 01/02/25 0305  NA 138 137 136 135  K 3.4* 3.2* 3.5 3.2*  CL 99 96 95* 96*  CO2 30 32 30 28  GLUCOSE 88 71 110* 94  BUN 6* 6 7 8   CREATININE 0.62 0.65 0.64 0.61  CALCIUM  9.7 9.4 9.4 8.8*    CBC: Recent Labs  Lab 12/26/24 1545 12/30/24 1048 01/01/25 1341 01/02/25 0305  WBC 12.2* 12.6* 11.8* 10.8*  HGB 13.8 14.0 14.7 12.2  HCT 40.3 39.8 42.2 35.5*  MCV 90.2 88.8 90.8 91.0  PLT 376 395.0 395 324    LFT Recent Labs  Lab 12/26/24 1545 12/30/24 1048 01/01/25 1341 01/02/25 0305  AST 13  13 11 15  12*  ALT 11  11 9 10 7   ALKPHOS  --  58 69 58  BILITOT 0.3  0.3 0.4 0.4 0.4  PROT 7.3  7.3 7.1 7.7 6.4*  ALBUMIN  --  4.3 4.2 3.5     Antibiotics: Anti-infectives (From admission, onward)    None        CONSULTS   Code Status: Full code  Family Communication: No family at bedside     Subjective   Patient complains of abdominal pain.  MRI abdomen obtained yesterday showed pancreatic mass.   Objective    Physical Examination:   General-appears in no acute distress Heart-S1-S2, regular, no murmur auscultated Lungs-clear to auscultation bilaterally, no wheezing or crackles auscultated Abdomen-soft, nontender, no  organomegaly Extremities-no edema in the lower extremities Neuro-alert, oriented x3, no focal deficit noted            Laurian Edrington S Sue Fernicola   [facility] If 7PM-7AM, please contact night-coverage at [email or web address], Office  [phone]   01/02/2025, 9:08 AM  LOS: 0 days

## 2025-01-03 DIAGNOSIS — R933 Abnormal findings on diagnostic imaging of other parts of digestive tract: Secondary | ICD-10-CM | POA: Diagnosis not present

## 2025-01-03 DIAGNOSIS — J45909 Unspecified asthma, uncomplicated: Secondary | ICD-10-CM | POA: Diagnosis not present

## 2025-01-03 DIAGNOSIS — F419 Anxiety disorder, unspecified: Secondary | ICD-10-CM | POA: Diagnosis not present

## 2025-01-03 DIAGNOSIS — K859 Acute pancreatitis without necrosis or infection, unspecified: Secondary | ICD-10-CM | POA: Diagnosis not present

## 2025-01-03 DIAGNOSIS — K219 Gastro-esophageal reflux disease without esophagitis: Secondary | ICD-10-CM | POA: Diagnosis not present

## 2025-01-03 LAB — CEA: CEA: 3.1 ng/mL (ref 0.0–4.7)

## 2025-01-03 LAB — CANCER ANTIGEN 19-9: CA 19-9: 168 U/mL — ABNORMAL HIGH (ref 0–35)

## 2025-01-03 LAB — URINALYSIS, ROUTINE W REFLEX MICROSCOPIC
Bacteria, UA: NONE SEEN
Bilirubin Urine: NEGATIVE
Glucose, UA: NEGATIVE mg/dL
Ketones, ur: NEGATIVE mg/dL
Leukocytes,Ua: NEGATIVE
Nitrite: NEGATIVE
Protein, ur: NEGATIVE mg/dL
Specific Gravity, Urine: 1.002 — ABNORMAL LOW (ref 1.005–1.030)
pH: 6 (ref 5.0–8.0)

## 2025-01-03 LAB — BASIC METABOLIC PANEL WITH GFR
Anion gap: 10 (ref 5–15)
BUN: 6 mg/dL (ref 6–20)
CO2: 25 mmol/L (ref 22–32)
Calcium: 8.6 mg/dL — ABNORMAL LOW (ref 8.9–10.3)
Chloride: 102 mmol/L (ref 98–111)
Creatinine, Ser: 0.55 mg/dL (ref 0.44–1.00)
GFR, Estimated: >60 mL/min
Glucose, Bld: 98 mg/dL (ref 70–99)
Potassium: 4.4 mmol/L (ref 3.5–5.1)
Sodium: 137 mmol/L (ref 135–145)

## 2025-01-03 NOTE — Plan of Care (Signed)

## 2025-01-03 NOTE — Progress Notes (Addendum)
 Patient ID: Monica Dillon, female   DOB: [birth year], 59 y.o.   MRN: [id number]    Progress Note   Subjective   Day # 3 CC; acute pancreatitis/abnormal MRI  Labs-CEA 3.1 CA 19-9 -168 IgG4/ANCA titer pending Lipid panel/triglycerides 109 BUN 6/creatinine 0.55  Patient looking and feeling much better today, up and around the room.  She says the intravenous pain medications and antiemetics are helping quite a bit.  She has tolerated clear liquids with no complaint of nausea vomiting today.  She thinks her pain has improved somewhat still primarily hurting in the epigastrium right upper quadrant.    Objective   Vital signs in last 24 hours: Temp:  [97.9 F (36.6 C)-99 F (37.2 C)] 97.9 F (36.6 C) (01/17 0812) Pulse Rate:  [68-77] 68 (01/17 0812) Resp:  [18] 18 (01/17 0812) BP: (93-130)/(52-63) 102/58 (01/17 0812) SpO2:  [95 %-97 %] 95 % (01/17 0812) Last BM Date : 01/02/25 General:   [name] in NAD, family at bedside Heart:  Regular rate and rhythm; no murmurs Lungs: Respirations even and unlabored, lungs CTA bilaterally Abdomen:  Soft, she is tender in the epigastrium right upper quadrant no guarding or rebound no palpable mass or hepatosplenomegaly, normal bowel sounds. Extremities:  Without edema. Neurologic:  Alert and oriented,  grossly normal neurologically. Psych:  Cooperative. Normal mood and affect.  Intake/Output from previous day: 01/16 0701 - 01/17 0700 In: -  Out: 1200 [Urine:1200] Intake/Output this shift: No intake/output data recorded.  Lab Results: Recent Labs    01/01/25 1341 01/02/25 0305  WBC 11.8* 10.8*  HGB 14.7 12.2  HCT 42.2 35.5*  PLT 395 324   BMET Recent Labs    01/01/25 1341 01/02/25 0305 01/03/25 0539  NA 136 135 137  K 3.5 3.2* 4.4  CL 95* 96* 102  CO2 30 28 25   GLUCOSE 110* 94 98  BUN 7 8 6   CREATININE 0.64 0.61 0.55  CALCIUM  9.4 8.8* 8.6*   LFT Recent Labs    01/02/25 0305  PROT 6.4*  ALBUMIN 3.5  AST 12*   ALT 7  ALKPHOS 58  BILITOT 0.4   PT/INR No results for input(s): LABPROT, INR in the last 72 hours.     Assessment / Plan:    #40 59 year old female with no prior history of pancreatitis with onset of mid back pain which eventually began radiating around into the right upper quadrant and epigastrium over the past couple of weeks.  Pain described as constant, progressive over time and associated with nausea and vomiting for couple of days prior to admission.  Diagnosed with acute pancreatitis with ER visit on 12/23/2024 by labs and imaging and discharged to home with plans for outpatient GI evaluation  She had not done well over the past couple of weeks, PCP had done abdominal ultrasound and then MRI/MRCP on 01/02/2024 and she came back to the emergency room after that due to nausea vomiting and pain.  MRI/MRCP shows a heterogeneous masslike area centered between the pancreatic head and the proximal duodenum with thick irregular enhancing walls and central nonenhancing areas.  There is some mild external mass effect over the proximal duodenum and main pancreatic duct slightly dilated to 4 mm  Etiology of the acute pancreatitis is unclear at this time, autoimmune markers pending CEA normal CA 19-9 elevated as above  Patient feeling much better since admission with IV analgesics and IV antiemetics and is tolerating liquids without difficulty  #2 history of asthma 3.  History of hyperlipidemia  Plan; will advance to full liquid diet Continue IV fluids and IV analgesics/antiemetics/supportive management  Discussed plan with patient and family at bedside with primary goal to allow her time for the acute pancreatitis to settle down while she is here in the hospital, will convert to oral antiemetic and oral analgesics perhaps tomorrow if she continues to do well  She will need to EUS/probable FNA-if and we will discuss with advanced endoscopy/Dr. Mansouraty on Monday regarding  timing.  Repeat labs in a.m. to include lipase, GI will follow-up tomorrow      Principal Problem:   Acute on chronic pancreatitis (HCC) Active Problems:   GERD (gastroesophageal reflux disease)   Anxiety and depression   Hyperlipidemia   Asthma     LOS: 0 days   Amy Esterwood PA-C 01/03/2025, 1:39 PM    Attending physician's note   I have taken history, reviewed the chart and examined the patient. I performed a substantive portion of this encounter, including complete performance of at least one of the key components, in conjunction with the APP. I agree with the Advanced Practitioner's note, impression and recommendations.   Acute pancreatitis- ?  Etiology. MRCP with heterogenous mass between pancreatic head and proximal duodenum with thick irregular enhancing walls.  Elevated CA 19-9: 168  Incidental finding-mild to moderate circumferential thickening of TI. ?  Etiology.  No previous history of Crohn's disease.  No previous GI problems.  Would eventually need colonoscopy as outpatient.  Plan; - Recheck labs including lipase in AM. - Advance diet as tolerated - Follow IgG4 levels - Will run it by Dr. Wilhelmenia regarding EUS when he returns    Anselm Bring, MD [facility] [phone]

## 2025-01-03 NOTE — Progress Notes (Signed)
 [facility]  PROGRESS NOTE  Monica Dillon FMW:[id number] DOB: [birth year] DOA: 01/01/2025 PCP: Domenica Harlene LABOR, MD   Brief HPI:    59 y.o. female with medical history significant of hyperlipidemia, GERD, chronic pancreatitis, anxiety, depression, asthma presenting with persistent abdominal pain   Patient has had symptoms on and off for the past 10 days or so.  Seen in the ED on 1/6 for abdominal pain found to have acute pancreatitis with mildly elevated lipase patient discussed with GI recommended outpatient/conservative treatment as patient was not needing significant pain medication.  Was prescribed Zofran .  Return 2 days later with persistent/worsening abdominal pain labs were again reassuring and patient was prescribed further Zofran  and was at this time prescribed pain medication as her pain did improve with p.o. pain medication in the ED.   Patient has since followed up with her PCP and has had new prescription for further pain medication and additional labs and imaging were obtained.  MRI ordered due to pancreatic pseudocyst seen on CT in the ED with recommendation for follow-up MRI.     Assessment/Plan:   Acute on chronic pancreatitis/pancreatic mass -Patient presented with abdominal pain, patient abdominal ultrasound for pancreatic mass -Patient was supposed to follow-up with GI as outpatient -Came to hospital with worsening abdominal pain, MRI abdomen ordered as she had a CT of the abdomen on 12/23/2024 which showed pancreatic cyst -MRI abdomen showed heterogeneous mass centered between the pancreatic head and proximal duodenum.  Lesion exhibits thick irregular enhancing walls and central nonenhancing areas. -Likely endoscopic ultrasound/biopsy of the pancreatic mass next week; CA 19-9 elevated at 168 - Gastroenterology consulted, likely endoscopic ultrasound next week.  Anxiety/depression - Continue as needed Valium    Asthma - Replace home Trelegy with formulary  Breztri   Hypokalemia - Replete        DVT prophylaxis: Lovenox   Medications     budesonide -glycopyrrolate -formoterol   2 puff Inhalation BID   enoxaparin  (LOVENOX ) injection  40 mg Subcutaneous Q24H   metoCLOPramide  (REGLAN ) injection  5 mg Intravenous Q8H   sodium chloride  flush  3 mL Intravenous Q12H     Data Reviewed:   CBG:  No results for input(s): GLUCAP in the last 168 hours.  SpO2: 95 %    Vitals:   01/03/25 0009 01/03/25 0458 01/03/25 0756 01/03/25 0812  BP: 98/61 (!) 93/52  (!) 102/58  Pulse: 73 69 70 68  Resp: 18 18 18 18   Temp: 98.8 F (37.1 C) 99 F (37.2 C)  97.9 F (36.6 C)  TempSrc:      SpO2: 96% 96% 96% 95%  Weight:      Height:          Data Reviewed:  Basic Metabolic Panel: Recent Labs  Lab 12/30/24 1048 01/01/25 1341 01/02/25 0305 01/03/25 0539  NA 137 136 135 137  K 3.2* 3.5 3.2* 4.4  CL 96 95* 96* 102  CO2 32 30 28 25   GLUCOSE 71 110* 94 98  BUN 6 7 8 6   CREATININE 0.65 0.64 0.61 0.55  CALCIUM  9.4 9.4 8.8* 8.6*    CBC: Recent Labs  Lab 12/30/24 1048 01/01/25 1341 01/02/25 0305  WBC 12.6* 11.8* 10.8*  HGB 14.0 14.7 12.2  HCT 39.8 42.2 35.5*  MCV 88.8 90.8 91.0  PLT 395.0 395 324    LFT Recent Labs  Lab 12/30/24 1048 01/01/25 1341 01/02/25 0305  AST 11 15 12*  ALT 9 10 7   ALKPHOS 58 69 58  BILITOT 0.4 0.4 0.4  PROT 7.1 7.7 6.4*  ALBUMIN 4.3 4.2 3.5     Antibiotics: Anti-infectives (From admission, onward)    None        CONSULTS   Code Status: Full code  Family Communication: No family at bedside     Subjective   Patient seen and examined, continues to have intermittent abdominal pain.   Objective    Physical Examination:   General-appears in no acute distress Heart-S1-S2, regular, no murmur auscultated Lungs-clear to auscultation bilaterally, no wheezing or crackles auscultated Abdomen-soft, nontender, no organomegaly Extremities-no edema in the lower  extremities Neuro-alert, oriented x3, no focal deficit noted          Abhishek Levesque S Alaric Gladwin   [facility] If 7PM-7AM, please contact night-coverage at [email or web address], Office  [phone]   01/03/2025, 8:29 AM  LOS: 0 days

## 2025-01-04 DIAGNOSIS — K219 Gastro-esophageal reflux disease without esophagitis: Secondary | ICD-10-CM | POA: Diagnosis not present

## 2025-01-04 DIAGNOSIS — J45909 Unspecified asthma, uncomplicated: Secondary | ICD-10-CM | POA: Diagnosis not present

## 2025-01-04 DIAGNOSIS — K859 Acute pancreatitis without necrosis or infection, unspecified: Secondary | ICD-10-CM | POA: Diagnosis not present

## 2025-01-04 DIAGNOSIS — F419 Anxiety disorder, unspecified: Secondary | ICD-10-CM | POA: Diagnosis not present

## 2025-01-04 DIAGNOSIS — E782 Mixed hyperlipidemia: Secondary | ICD-10-CM | POA: Diagnosis not present

## 2025-01-04 LAB — LIPASE, BLOOD: Lipase: 219 U/L — ABNORMAL HIGH (ref 11–51)

## 2025-01-04 LAB — CBC WITH DIFFERENTIAL/PLATELET
Abs Immature Granulocytes: 0.03 K/uL (ref 0.00–0.07)
Basophils Absolute: 0.1 K/uL (ref 0.0–0.1)
Basophils Relative: 1 %
Eosinophils Absolute: 0.3 K/uL (ref 0.0–0.5)
Eosinophils Relative: 3 %
HCT: 33.7 % — ABNORMAL LOW (ref 36.0–46.0)
Hemoglobin: 11.7 g/dL — ABNORMAL LOW (ref 12.0–15.0)
Immature Granulocytes: 0 %
Lymphocytes Relative: 27 %
Lymphs Abs: 2.6 K/uL (ref 0.7–4.0)
MCH: 31.5 pg (ref 26.0–34.0)
MCHC: 34.7 g/dL (ref 30.0–36.0)
MCV: 90.6 fL (ref 80.0–100.0)
Monocytes Absolute: 0.9 K/uL (ref 0.1–1.0)
Monocytes Relative: 9 %
Neutro Abs: 6 K/uL (ref 1.7–7.7)
Neutrophils Relative %: 60 %
Platelets: 306 K/uL (ref 150–400)
RBC: 3.72 MIL/uL — ABNORMAL LOW (ref 3.87–5.11)
RDW: 11.7 % (ref 11.5–15.5)
WBC: 9.9 K/uL (ref 4.0–10.5)
nRBC: 0 % (ref 0.0–0.2)

## 2025-01-04 MED ORDER — OXYCODONE HCL 5 MG PO TABS: 5.0000 mg | ORAL_TABLET | Freq: Four times a day (QID) | ORAL | 0 refills | Status: DC | PRN

## 2025-01-04 MED ORDER — ONDANSETRON 8 MG PO TBDP: 8.0000 mg | ORAL_TABLET | Freq: Three times a day (TID) | ORAL | 0 refills | Status: AC | PRN

## 2025-01-04 NOTE — TOC CM/SW Note (Signed)
 Transition of Care [facility]) - Inpatient Brief Assessment   Patient Details  Name: Monica Dillon MRN: [id number] Date of Birth: 01-04-1966  [facility]) CM/SW Contact:    Sudie Erminio Deems, RN Phone Number: 01/04/2025, 12:16 PM   Clinical Narrative: Patient presented for abdominal pain. Patient has PCP and insurance. No home needs identified at this time.    Transition of Care Asessment: Insurance and Status: Insurance coverage has been reviewed Patient has primary care physician: Yes Home environment has been reviewed: reviewed Prior level of function:: independent Prior/Current Home Services: No current home services Social Drivers of Health Review: SDOH reviewed no interventions necessary Readmission risk has been reviewed: Yes Transition of care needs: no transition of care needs at this time

## 2025-01-04 NOTE — Progress Notes (Incomplete)
 [facility]  PROGRESS NOTE  Monica Dillon FMW:[id number] DOB: [birth year] DOA: 01/01/2025 PCP: Domenica Harlene LABOR, MD   Brief HPI:    59 y.o. female with medical history significant of hyperlipidemia, GERD, chronic pancreatitis, anxiety, depression, asthma presenting with persistent abdominal pain   Patient has had symptoms on and off for the past 10 days or so.  Seen in the ED on 1/6 for abdominal pain found to have acute pancreatitis with mildly elevated lipase patient discussed with GI recommended outpatient/conservative treatment as patient was not needing significant pain medication.  Was prescribed Zofran .  Return 2 days later with persistent/worsening abdominal pain labs were again reassuring and patient was prescribed further Zofran  and was at this time prescribed pain medication as her pain did improve with p.o. pain medication in the ED.   Patient has since followed up with her PCP and has had new prescription for further pain medication and additional labs and imaging were obtained.  MRI ordered due to pancreatic pseudocyst seen on CT in the ED with recommendation for follow-up MRI.     Assessment/Plan:   Acute on chronic pancreatitis/pancreatic mass -Patient presented with abdominal pain, patient abdominal ultrasound for pancreatic mass -Patient was supposed to follow-up with GI as outpatient -Came to hospital with worsening abdominal pain, MRI abdomen ordered as she had a CT of the abdomen on 12/23/2024 which showed pancreatic cyst -MRI abdomen showed heterogeneous mass centered between the pancreatic head and proximal duodenum.  Lesion exhibits thick irregular enhancing walls and central nonenhancing areas. -Likely endoscopic ultrasound/biopsy of the pancreatic mass next week; CA 19-9 elevated at 168 - Gastroenterology consulted, likely endoscopic ultrasound next week.  Anxiety/depression - Continue as needed Valium    Asthma - Replace home Trelegy with formulary  Breztri   Hypokalemia - Replete        DVT prophylaxis: Lovenox   Medications     budesonide -glycopyrrolate -formoterol   2 puff Inhalation BID   enoxaparin  (LOVENOX ) injection  40 mg Subcutaneous Q24H   metoCLOPramide  (REGLAN ) injection  5 mg Intravenous Q8H   sodium chloride  flush  3 mL Intravenous Q12H     Data Reviewed:   CBG:  No results for input(s): GLUCAP in the last 168 hours.  SpO2: 98 %    Vitals:   01/03/25 2042 01/04/25 0039 01/04/25 0431 01/04/25 0825  BP:  (!) 107/34 (!) 104/59 106/68  Pulse:  68 71 70  Resp:   12 16  Temp:  98 F (36.7 C) 98 F (36.7 C) 98.3 F (36.8 C)  TempSrc:    Oral  SpO2: 98% 97% 99% 98%  Weight:      Height:          Data Reviewed:  Basic Metabolic Panel: Recent Labs  Lab 12/30/24 1048 01/01/25 1341 01/02/25 0305 01/03/25 0539  NA 137 136 135 137  K 3.2* 3.5 3.2* 4.4  CL 96 95* 96* 102  CO2 32 30 28 25   GLUCOSE 71 110* 94 98  BUN 6 7 8 6   CREATININE 0.65 0.64 0.61 0.55  CALCIUM  9.4 9.4 8.8* 8.6*    CBC: Recent Labs  Lab 12/30/24 1048 01/01/25 1341 01/02/25 0305 01/04/25 0329  WBC 12.6* 11.8* 10.8* 9.9  NEUTROABS  --   --   --  6.0  HGB 14.0 14.7 12.2 11.7*  HCT 39.8 42.2 35.5* 33.7*  MCV 88.8 90.8 91.0 90.6  PLT 395.0 395 324 306    LFT Recent Labs  Lab 12/30/24 1048 01/01/25 1341 01/02/25 0305  AST  11 15 12*  ALT 9 10 7   ALKPHOS 58 69 58  BILITOT 0.4 0.4 0.4  PROT 7.1 7.7 6.4*  ALBUMIN 4.3 4.2 3.5     Antibiotics: Anti-infectives (From admission, onward)    None        CONSULTS   Code Status: Full code  Family Communication: No family at bedside     Subjective      Objective    Physical Examination:            Monica Dillon   [facility] If 7PM-7AM, please contact night-coverage at [email or web address], Office  [phone]   01/04/2025, 9:21 AM  LOS: 0 days

## 2025-01-04 NOTE — Progress Notes (Addendum)
 Patient ID: Monica Dillon, female   DOB: 12-Jan-1966, 59 y.o.   MRN: [id number]    Progress Note   Subjective   Day # 3 CC; acute pancreatitis/abnormal MRI  IgG4 pending ANCA titer pending Lipase 219 improved WBC 9.9/Hb 11.7/hematocrit 33.7  Tolerating full liquids without difficulty, says she has been eating small amounts and stretching out her meals which is more comfortable, no nausea or vomiting.  She has primarily been taking analgesics orally and feels that her pain has been well-controlled except at night.  States she had to wait 2 hours for pain medication during the nighttime hours and had increase in pain but not as bad as it had been on admission. She feels that she can manage at home at this point as long as she has antiemetic and analgesics.   Objective   Vital signs in last 24 hours: Temp:  [98 F (36.7 C)-98.6 F (37 C)] 98.3 F (36.8 C) (01/18 0825) Pulse Rate:  [68-74] 70 (01/18 0825) Resp:  [12-18] 16 (01/18 0825) BP: (104-113)/(34-68) 106/68 (01/18 0825) SpO2:  [97 %-99 %] 98 % (01/18 0825) Last BM Date : 01/01/25 General:   Older white female in NAD Heart:  Regular rate and rhythm; no murmurs Lungs: Respirations even and unlabored, lungs CTA bilaterally Abdomen:  Soft, continues to have mild tenderness in the epigastrium and right upper quadrant, no guarding or rebound, no mass or hepatosplenomegaly Extremities:  Without edema. Neurologic:  Alert and oriented,  grossly normal neurologically. Psych:  Cooperative. Normal mood and affect.  Intake/Output from previous day: 01/17 0701 - 01/18 0700 In: -  Out: 100 [Urine:100] Intake/Output this shift: No intake/output data recorded.  Lab Results: Recent Labs    01/01/25 1341 01/02/25 0305 01/04/25 0329  WBC 11.8* 10.8* 9.9  HGB 14.7 12.2 11.7*  HCT 42.2 35.5* 33.7*  PLT 395 324 306   BMET Recent Labs    01/01/25 1341 01/02/25 0305 01/03/25 0539  NA 136 135 137  K 3.5 3.2* 4.4  CL 95* 96* 102   CO2 30 28 25   GLUCOSE 110* 94 98  BUN 7 8 6   CREATININE 0.64 0.61 0.55  CALCIUM  9.4 8.8* 8.6*   LFT Recent Labs    01/02/25 0305  PROT 6.4*  ALBUMIN 3.5  AST 12*  ALT 7  ALKPHOS 58  BILITOT 0.4   PT/INR No results for input(s): LABPROT, INR in the last 72 hours.  Studies/Results: No results found.     Assessment / Plan:    #56 59 year old female with no previous history of pancreatitis with onset of mid back pain which gradually progressed to right upper quadrant/epigastric pain radiating around from the back and present over the past couple of weeks.  Pain had been constant and progressive and associated with nausea and vomiting for 2 days prior to admission.  Patient had 2 ER visits, and had been sent home with pain medications and plan for GI follow-up.  Her PCP had proceeded with ultrasound, then MRI/MRCP/done 01/02/2024.  She presented back to the ER that day because she was having nausea and vomiting.  MRCP shows a heterogeneous masslike area centered between the pancreatic head and the proximal duodenum with thick irregular enhancing walls and central nonenhancing areas, mild external mass effect over the proximal duodenum and main pancreatic duct slightly dilated to 4 mm.  CEA normal CA 19-9= 168 ANCA pending IgG4 pending  Lipase has continued to downtrend another labs reassuring  Patient feeling much better at  this point, tolerating full liquids without difficulty and feels that her pain is managed with oral analgesics.  #2 asthma #3.  History of hyperlipidemia #4 abnormal terminal ileum with mild to moderate circumferential wall thickening without any peri enteric fat stranding or mucosal enhancement-findings nonspecific but raise possibility of a chronic enteritis or IBD.  Patient asymptomatic-no prior colonoscopy Will need further workup as outpatient  Plan; discussed advancing to low-fat diet as she tolerates, smaller meals with more frequent  feedings Okay for discharge home today from GI perspective.  She had been on short-term leave from work over the past week but was supposed to return on 01/07/2025.  Advised her to get in touch with her PCP tomorrow morning to have that extended out at least another week.  Continue Zofran  8 mg every 6 hours as needed for nausea vomiting Please discharge with at least a 10-day supply of oxycodone  5 mg every 4-6 hours as needed for pain  We will ask advanced biliary/Dr. Mansouraty to review her imaging on his return this week, and office will reach out to her to get her scheduled for outpatient EUS/FNA    Principal Problem:   Acute on chronic pancreatitis (HCC) Active Problems:   GERD (gastroesophageal reflux disease)   Anxiety and depression   Hyperlipidemia   Asthma     LOS: 0 days   Amy EsterwoodPA-C  01/04/2025, 10:03 AM     Attending physician's note   I have taken history, reviewed the chart and examined the patient. I performed a substantive portion of this encounter, including complete performance of at least one of the key components, in conjunction with the APP. I agree with the Advanced Practitioner's note, impression and recommendations.   Patient much improved.  Would like to go home.   Acute pancreatitis- ?  Etiology. MRCP with heterogenous mass between pancreatic head and proximal duodenum with thick irregular enhancing walls.  Elevated CA 19-9: 168   Incidental finding-mild to moderate circumferential thickening of TI. ?  Etiology.  No previous history of Crohn's disease.  No previous GI problems.  Would eventually need colonoscopy as outpt.  Plan: - Advance diet - OK to D/C home - Will ask Dr. Wilhelmenia to review imaging.  Would likely need EUS/FNA.  Have sent staff message - Would eventually need outpatient colonoscopy as well.   Anselm Bring, MD [facility] [phone]

## 2025-01-04 NOTE — Plan of Care (Signed)

## 2025-01-04 NOTE — Discharge Summary (Signed)
 " Physician Discharge Summary   Patient: Monica Dillon MRN: [id number] DOB: 1966-03-12  Admit date:     01/01/2025  Discharge date: 01/04/25  Discharge Physician: Sabas GORMAN Brod   PCP: Domenica Harlene LABOR, MD   Recommendations at discharge:   Follow-up [facility], Dr. Brodie as outpatient in 1 week  Discharge Diagnoses: Principal Problem:   Acute on chronic pancreatitis (HCC) Active Problems:   GERD (gastroesophageal reflux disease)   Anxiety and depression   Hyperlipidemia   Asthma  Resolved Problems:   * No resolved hospital problems. *  Hospital Course: 59 y.o. female with medical history significant of hyperlipidemia, GERD, chronic pancreatitis, anxiety, depression, asthma presenting with persistent abdominal pain   Patient has had symptoms on and off for the past 10 days or so.  Seen in the ED on 1/6 for abdominal pain found to have acute pancreatitis with mildly elevated lipase patient discussed with GI recommended outpatient/conservative treatment as patient was not needing significant pain medication.  Was prescribed Zofran .  Return 2 days later with persistent/worsening abdominal pain labs were again reassuring and patient was prescribed further Zofran  and was at this time prescribed pain medication as her pain did improve with p.o. pain medication in the ED.   Patient has since followed up with her PCP and has had new prescription for further pain medication and additional labs and imaging were obtained.  MRI ordered due to pancreatic pseudocyst seen on CT in the ED with recommendation for follow-up MRI.  Assessment and Plan:  Acute on chronic pancreatitis/pancreatic mass -Patient presented with abdominal pain, patient abdominal ultrasound for pancreatic mass -Patient was supposed to follow-up with GI as outpatient -Came to hospital with worsening abdominal pain, MRI abdomen ordered as she had a CT of the abdomen on 12/23/2024 which showed pancreatic cyst -MRI abdomen showed  heterogeneous mass centered between the pancreatic head and proximal duodenum.  Lesion exhibits thick irregular enhancing walls and central nonenhancing areas. -Likely endoscopic ultrasound/biopsy of the pancreatic mass next week; CA 19-9 elevated at 168 - Gastroenterology consulted, likely endoscopic ultrasound next week. - Plan is to discharge patient home and follow-up with [facility]  as outpatient for endoscopic ultrasound/biopsy -Will discharge on oxycodone  and Zofran  as needed   Anxiety/depression - Continue as needed Valium    Asthma - Replace home Trelegy with formulary Breztri    Hypokalemia - Replete      Consultants:  Procedures performed:  Disposition: Home Diet recommendation:  Regular diet DISCHARGE MEDICATION: Allergies as of 01/04/2025       Reactions   Lipitor [atorvastatin ]    Severe leg weakness        Medication List     STOP taking these medications    ondansetron  4 MG tablet Commonly known as: ZOFRAN    oxyCODONE -acetaminophen  5-325 MG tablet Commonly known as: PERCOCET/ROXICET       TAKE these medications    acetaminophen  325 MG tablet Commonly known as: TYLENOL  Take 650 mg by mouth every 6 (six) hours as needed for headache.   diazepam  5 MG tablet Commonly known as: VALIUM  Take 5 mg by mouth daily as needed.   ondansetron  8 MG disintegrating tablet Commonly known as: ZOFRAN -ODT Take 1 tablet (8 mg total) by mouth every 8 (eight) hours as needed for nausea or vomiting.   oxyCODONE  5 MG immediate release tablet Commonly known as: Oxy IR/ROXICODONE  Take 1 tablet (5 mg total) by mouth every 6 (six) hours as needed for moderate pain (pain score 4-6) or severe  pain (pain score 7-10).   Trelegy Ellipta  200-62.5-25 MCG/ACT Aepb Generic drug: Fluticasone-Umeclidin-Vilant Inhale 1 puff into the lungs daily.        Follow-up Information     Mansouraty, Aloha Raddle., MD. Schedule an appointment as soon as possible for a visit  in 1 week(s).   Specialties: Gastroenterology, Internal Medicine Contact information: [address] [place] [address] [phone]                Discharge Exam: [name]   01/01/25 1335 01/01/25 1952  Weight: 53.1 kg 53.3 kg     Condition at discharge: good  The results of significant diagnostics from this hospitalization (including imaging, microbiology, ancillary and laboratory) are listed below for reference.   Imaging Studies: MR Abdomen W Wo Contrast Result Date: 01/01/2025 CLINICAL DATA:  Crohn's suspected; Crohn's suspected crohn's suspected. EXAM: MRI ABDOMEN WITHOUT AND WITH CONTRAST TECHNIQUE: Multiplanar multisequence MR imaging of the abdomen was performed both before and after the administration of intravenous contrast. CONTRAST:  5mL GADAVIST  GADOBUTROL  1 MMOL/ML IV SOLN COMPARISON:  CT scan abdomen and pelvis from 12/23/2024. FINDINGS: Lower chest: Unremarkable MR appearance to the lung bases. No pleural effusion. No pericardial effusion. Normal heart size. Hepatobiliary: The liver is normal in size. Noncirrhotic configuration. There is a single sub 5 mm cyst in the right hepatic dome. No suspicious liver lesion. No intrahepatic or extrahepatic bile duct dilatation. No choledocholithiasis. Unremarkable gallbladder. Pancreas: T1 hyperintense signal intensity of the pancreas. No peripancreatic fat stranding. There is a mildly T2 hyperintense, heterogeneous, approximately 2.6 x 3.5 cm lesion centered adjacent to the pancreatic head however, small portion extending into the pancreatic head. The lesion exhibits thick irregular enhancing walls and central nonenhancing areas. The lesion exerts mild-to-moderate external mass effect over the proximal duodenum as well as main pancreatic duct/extrahepatic bile ducts. No other focal pancreatic lesion. The main pancreatic duct is slightly dilated measuring up to 4 mm. Spleen:  Within normal limits in size and appearance. No  focal mass. Adrenals/Urinary Tract: Unremarkable adrenal glands. No hydroureteronephrosis. There is a sub 5 mm simple cyst arising from the right kidney interpolar region, laterally. No suspicious renal mass on either side. Stomach/Bowel: No disproportionate dilation of small or large bowel loops. Unremarkable appendix. There is a heterogeneous mass centered between the pancreatic head and proximal duodenum, as discussed above. No other focal enteric lesion seen. There is mild-to-moderate circumferential wall thickening of the terminal ileum (series 3, image 27) without perienteric fat stranding and mucosal hyperenhancement. Findings are nonspecific and may be due to transient peristalsis/contraction versus chronic enteritis. Correlate clinically to determine the need for additional imaging with CT enterography or MR enterography. Vascular/Lymphatic: No pathologically enlarged lymph nodes identified. No abdominal aortic aneurysm demonstrated. No ascites. Other:  None. Musculoskeletal: No suspicious bone lesions identified. A hemangioma is noted in the T12 vertebral body. IMPRESSION: 1. There is a heterogeneous mass centered between the pancreatic head and proximal duodenum, as described above. The lesion exhibits thick irregular enhancing walls and central nonenhancing areas. The lesion exerts mild-to-moderate external mass effect over the proximal duodenum as well as main pancreatic duct/extrahepatic bile ducts. No other focal pancreatic lesion. The main pancreatic duct is slightly dilated measuring up to 4 mm. Findings are nonspecific but differential diagnosis includes pancreatic pseudocyst (especially if history of recent pancreatitis), metastatic lymphadenopathy, etc. 2. There is mild-to-moderate circumferential wall thickening of the terminal ileum without perienteric fat stranding and mucosal hyperenhancement. Findings are nonspecific and may be due  to transient peristalsis/contraction versus chronic  enteritis. Correlate clinically to determine the need for additional imaging with CT enterography or MR enterography. 3. Multiple other nonacute observations, as described above. Electronically Signed   By: Ree Molt M.D.   On: 01/01/2025 13:40   MR 3D Recon At Scanner Result Date: 01/01/2025 CLINICAL DATA:  Crohn's suspected; Crohn's suspected crohn's suspected. EXAM: MRI ABDOMEN WITHOUT AND WITH CONTRAST TECHNIQUE: Multiplanar multisequence MR imaging of the abdomen was performed both before and after the administration of intravenous contrast. CONTRAST:  5mL GADAVIST  GADOBUTROL  1 MMOL/ML IV SOLN COMPARISON:  CT scan abdomen and pelvis from 12/23/2024. FINDINGS: Lower chest: Unremarkable MR appearance to the lung bases. No pleural effusion. No pericardial effusion. Normal heart size. Hepatobiliary: The liver is normal in size. Noncirrhotic configuration. There is a single sub 5 mm cyst in the right hepatic dome. No suspicious liver lesion. No intrahepatic or extrahepatic bile duct dilatation. No choledocholithiasis. Unremarkable gallbladder. Pancreas: T1 hyperintense signal intensity of the pancreas. No peripancreatic fat stranding. There is a mildly T2 hyperintense, heterogeneous, approximately 2.6 x 3.5 cm lesion centered adjacent to the pancreatic head however, small portion extending into the pancreatic head. The lesion exhibits thick irregular enhancing walls and central nonenhancing areas. The lesion exerts mild-to-moderate external mass effect over the proximal duodenum as well as main pancreatic duct/extrahepatic bile ducts. No other focal pancreatic lesion. The main pancreatic duct is slightly dilated measuring up to 4 mm. Spleen:  Within normal limits in size and appearance. No focal mass. Adrenals/Urinary Tract: Unremarkable adrenal glands. No hydroureteronephrosis. There is a sub 5 mm simple cyst arising from the right kidney interpolar region, laterally. No suspicious renal mass on either  side. Stomach/Bowel: No disproportionate dilation of small or large bowel loops. Unremarkable appendix. There is a heterogeneous mass centered between the pancreatic head and proximal duodenum, as discussed above. No other focal enteric lesion seen. There is mild-to-moderate circumferential wall thickening of the terminal ileum (series 3, image 27) without perienteric fat stranding and mucosal hyperenhancement. Findings are nonspecific and may be due to transient peristalsis/contraction versus chronic enteritis. Correlate clinically to determine the need for additional imaging with CT enterography or MR enterography. Vascular/Lymphatic: No pathologically enlarged lymph nodes identified. No abdominal aortic aneurysm demonstrated. No ascites. Other:  None. Musculoskeletal: No suspicious bone lesions identified. A hemangioma is noted in the T12 vertebral body. IMPRESSION: 1. There is a heterogeneous mass centered between the pancreatic head and proximal duodenum, as described above. The lesion exhibits thick irregular enhancing walls and central nonenhancing areas. The lesion exerts mild-to-moderate external mass effect over the proximal duodenum as well as main pancreatic duct/extrahepatic bile ducts. No other focal pancreatic lesion. The main pancreatic duct is slightly dilated measuring up to 4 mm. Findings are nonspecific but differential diagnosis includes pancreatic pseudocyst (especially if history of recent pancreatitis), metastatic lymphadenopathy, etc. 2. There is mild-to-moderate circumferential wall thickening of the terminal ileum without perienteric fat stranding and mucosal hyperenhancement. Findings are nonspecific and may be due to transient peristalsis/contraction versus chronic enteritis. Correlate clinically to determine the need for additional imaging with CT enterography or MR enterography. 3. Multiple other nonacute observations, as described above. Electronically Signed   By: Ree Molt M.D.    On: 01/01/2025 13:40   US  Abdomen Limited RUQ (LIVER/GB) Result Date: 12/27/2024 CLINICAL DATA:  Abnormal recent abdominal CT. EXAM: ULTRASOUND ABDOMEN LIMITED RIGHT UPPER QUADRANT COMPARISON:  None Available. FINDINGS: Gallbladder: No gallstones or wall thickening visualized (2.0 mm). No sonographic Beverley  sign noted by sonographer. Common bile duct: Diameter: 7.4 mm Liver: A 1.9 cm x 1.3 cm x 1.6 cm hypoechoic area is seen adjacent to the gallbladder fossa. No abnormal flow is seen within this region on color Doppler evaluation. There is diffusely increased echogenicity of the liver parenchyma. Portal vein is patent on color Doppler imaging with normal direction of blood flow towards the liver. Other: A 7.0 cm x 2.7 cm x 4.0 cm complex, heterogeneous mass is seen within the region inferior and medial to the gallbladder along the inferior aspect of the pancreatic head. IMPRESSION: 1. Large, complex mass along the inferior aspect of the head of the pancreas which corresponds to the area of abnormality seen on the prior abdomen pelvis CT. MRI correlation is recommended to further determine the presence of an underlying neoplasm. 2. Hepatic steatosis with an area of focal fatty sparing versus liver mass within the region adjacent to the gallbladder fossa. MRI correlation is recommended for improved characterization and further exclude the presence of an neoplastic process within the liver. Electronically Signed   By: Suzen Dials M.D.   On: 12/27/2024 17:32   CT ABDOMEN PELVIS W CONTRAST Result Date: 12/23/2024 CLINICAL DATA:  Back pain and abdominal pain x5 days. EXAM: CT ABDOMEN AND PELVIS WITH CONTRAST TECHNIQUE: Multidetector CT imaging of the abdomen and pelvis was performed using the standard protocol following bolus administration of intravenous contrast. RADIATION DOSE REDUCTION: This exam was performed according to the departmental dose-optimization program which includes automated exposure  control, adjustment of the mA and/or kV according to patient size and/or use of iterative reconstruction technique. CONTRAST:  OMNIPAQUE  IOHEXOL  300 MG/ML  SOLN COMPARISON:  None Available. FINDINGS: Lower chest: No acute abnormality. Hepatobiliary: No focal liver abnormality is seen. No gallstones, gallbladder wall thickening, or biliary dilatation. Pancreas: A 2.0 cm x 4.2 cm x 1.8 cm area of heterogeneous predominately low-attenuation (approximately 70.15 Hounsfield units) is seen within an enlarged pancreatic head. A mild amount of associated peripancreatic inflammatory fat stranding is noted. There is no evidence of pancreatic ductal dilatation. Spleen: Normal in size without focal abnormality. Adrenals/Urinary Tract: Adrenal glands are unremarkable. Kidneys are normal, without renal calculi, focal lesion, or hydronephrosis. Bladder is unremarkable. Stomach/Bowel: Stomach is within normal limits. Appendix appears normal. No evidence of bowel dilatation. Moderate to marked severity mural thickening (approximately 6.54 mm) is seen involving the terminal ileum. Vascular/Lymphatic: Aortic atherosclerosis. No enlarged abdominal or pelvic lymph nodes. Reproductive: Uterus and bilateral adnexa are unremarkable. Other: No abdominal wall hernia or abnormality. No abdominopelvic ascites. Musculoskeletal: No acute or significant osseous findings. IMPRESSION: 1. Findings consistent with acute pancreatitis with an associated pancreatic head pseudocyst. Correlation with pancreatic enzymes is recommended. Additional evaluation with MRI is recommended to exclude the presence of an underlying neoplastic process. 2. Moderate to marked severity terminal ileitis. Sequelae associated with Crohn's disease cannot be excluded. 3. Aortic atherosclerosis. Electronically Signed   By: Suzen Dials M.D.   On: 12/23/2024 12:19    Microbiology: Results for orders placed or performed in visit on 12/26/24  Urine Culture      Status: None   Collection Time: 12/26/24  3:45 PM   Specimen: Urine  Result Value Ref Range Status   MICRO NUMBER: [id number]  Final   SPECIMEN QUALITY: Adequate  Final   Sample Source URINE  Final   STATUS: FINAL  Final   Result: No Growth  Final    Labs: CBC: Recent Labs  Lab 12/30/24 1048 01/01/25  1341 01/02/25 0305 01/04/25 0329  WBC 12.6* 11.8* 10.8* 9.9  NEUTROABS  --   --   --  6.0  HGB 14.0 14.7 12.2 11.7*  HCT 39.8 42.2 35.5* 33.7*  MCV 88.8 90.8 91.0 90.6  PLT 395.0 395 324 306   Basic Metabolic Panel: Recent Labs  Lab 12/30/24 1048 01/01/25 1341 01/02/25 0305 01/03/25 0539  NA 137 136 135 137  K 3.2* 3.5 3.2* 4.4  CL 96 95* 96* 102  CO2 32 30 28 25   GLUCOSE 71 110* 94 98  BUN 6 7 8 6   CREATININE 0.65 0.64 0.61 0.55  CALCIUM  9.4 9.4 8.8* 8.6*   Liver Function Tests: Recent Labs  Lab 12/30/24 1048 01/01/25 1341 01/02/25 0305  AST 11 15 12*  ALT 9 10 7   ALKPHOS 58 69 58  BILITOT 0.4 0.4 0.4  PROT 7.1 7.7 6.4*  ALBUMIN 4.3 4.2 3.5   CBG: No results for input(s): GLUCAP in the last 168 hours.  Discharge time spent: greater than 30 minutes.  Signed: Sabas GORMAN Brod, MD [facility] 01/04/2025 "

## 2025-01-05 ENCOUNTER — Telehealth: Payer: Self-pay | Admitting: Physician Assistant

## 2025-01-05 ENCOUNTER — Ambulatory Visit (HOSPITAL_COMMUNITY)

## 2025-01-05 DIAGNOSIS — Z09 Encounter for follow-up examination after completed treatment for conditions other than malignant neoplasm: Secondary | ICD-10-CM | POA: Insufficient documentation

## 2025-01-05 LAB — IGG 4: IgG, Subclass 4: 13 mg/dL (ref 2–96)

## 2025-01-05 NOTE — Progress Notes (Unsigned)
" ° °  Acute Office Visit  Subjective:     Patient ID: Monica Dillon, female    DOB: [birth year], 59 y.o.   MRN: [id number]  No chief complaint on file.   HPI Patient is in today for hospital FU  Recent hospitalization Admission date: 01/02/24 Discharge date:01/05/24 CC: Abdominal pain Diagnosis: Acute on chronic pancreatitis  ROS  See HPI    Objective:    LMP 01/08/2017  {Vitals History (Optional):23777}  Physical Exam  Constitutional:      General: She is not in acute distress.    Appearance: Normal appearance. She is well-developed and normal weight.  HENT:     Head: Normocephalic and atraumatic.     Mouth/Throat:     Mouth: Mucous membranes are moist.     Pharynx: Oropharynx is clear.  Eyes:     General: No scleral icterus.    Extraocular Movements: Extraocular movements intact.     Conjunctiva/sclera: Conjunctivae normal.     Pupils: Pupils are equal, round, and reactive to light.  Cardiovascular:     Rate and Rhythm: Normal rate and regular rhythm.     Pulses: Normal pulses.     Heart sounds: Normal heart sounds. No murmur heard.    No friction rub. No gallop.  Pulmonary:     Effort: Pulmonary effort is normal.     Breath sounds: Normal breath sounds.  Abdominal:     General: Abdomen is flat. Bowel sounds are normal.     Palpations: Abdomen is soft.     Tenderness: There is generalized abdominal tenderness and tenderness in the right upper quadrant. There is no guarding or rebound.     Hernia: No hernia is present.  Musculoskeletal:        General: Normal range of motion.     Cervical back: Normal range of motion and neck supple.     Right lower leg: No edema.     Left lower leg: No edema.  Skin:    General: Skin is warm and dry.     Capillary Refill: Capillary refill takes less than 2 seconds.  Neurological:     General: No focal deficit present.     Mental Status: She is alert. Mental status is at baseline.  Psychiatric:        Mood and Affect: Mood  normal  No results found for any visits on 01/07/25.      Assessment & Plan:   Problem List Items Addressed This Visit       Digestive   Acute on chronic pancreatitis [facility])   Doing better with pain since hospital admission. Continue current medications as needed. Ensure follow up with GI. Alarm symptoms and Ed precautions discussed.         Other   Hospital discharge follow-up - Primary    No orders of the defined types were placed in this encounter.   No follow-ups on file.  Harlene LITTIE Jolly, NP   "

## 2025-01-05 NOTE — Telephone Encounter (Signed)
 Good morning Monica Dillon  The following patient called in about an authorization for her medication prescribed to her while hospitalized. She stated that a different provider prescribed them but she was under your care. Please advise as to how I am able to get a PA even with the provider not being established here.  Thank you

## 2025-01-05 NOTE — Assessment & Plan Note (Signed)
 Doing better with pain since hospital admission. Continue current medications as needed. Ensure follow up with GI. Alarm symptoms and Ed precautions discussed.

## 2025-01-06 ENCOUNTER — Ambulatory Visit: Payer: Self-pay | Admitting: Family Medicine

## 2025-01-06 ENCOUNTER — Inpatient Hospital Stay: Admitting: Family

## 2025-01-06 LAB — ANCA TITERS
Atypical P-ANCA titer: <1:20 {titer}
C-ANCA: <1:20 {titer}
P-ANCA: <1:20 {titer}

## 2025-01-07 ENCOUNTER — Other Ambulatory Visit (HOSPITAL_COMMUNITY): Payer: Self-pay

## 2025-01-07 ENCOUNTER — Inpatient Hospital Stay: Admitting: Student

## 2025-01-07 DIAGNOSIS — Z09 Encounter for follow-up examination after completed treatment for conditions other than malignant neoplasm: Secondary | ICD-10-CM

## 2025-01-07 DIAGNOSIS — K859 Acute pancreatitis without necrosis or infection, unspecified: Secondary | ICD-10-CM

## 2025-01-08 ENCOUNTER — Encounter (HOSPITAL_COMMUNITY): Payer: Self-pay

## 2025-01-08 ENCOUNTER — Other Ambulatory Visit (HOSPITAL_COMMUNITY): Payer: Self-pay

## 2025-01-08 NOTE — Telephone Encounter (Signed)
 Please call in Phenergan  25 mg po Q8hrs prn #20.  Sedation precautions until she sees Camie CHRISTIANS

## 2025-01-08 NOTE — Telephone Encounter (Signed)
 ERROR

## 2025-01-08 NOTE — Telephone Encounter (Signed)
 I am not sure how to handle this issue. The patient was seen in the hospital. She is scheduled to come seen here for the first time on Monday 01/12/26. Patient needs medication for nausea and vomiting. She received ondansetron  12/25/24. It was refilled during the last hospitalization but insurance will not cover it. She has received the maximum covered by her plan for the month.  Is there anything we can do?

## 2025-01-09 ENCOUNTER — Telehealth: Payer: Self-pay

## 2025-01-09 ENCOUNTER — Other Ambulatory Visit: Payer: Self-pay

## 2025-01-09 DIAGNOSIS — K859 Acute pancreatitis without necrosis or infection, unspecified: Secondary | ICD-10-CM

## 2025-01-09 DIAGNOSIS — K869 Disease of pancreas, unspecified: Secondary | ICD-10-CM

## 2025-01-09 MED ORDER — PROMETHAZINE HCL 25 MG PO TABS: 25.0000 mg | ORAL_TABLET | Freq: Three times a day (TID) | ORAL | 0 refills | Status: AC | PRN

## 2025-01-09 NOTE — Telephone Encounter (Signed)
 I was unable to reach the patient.  I left a detailed voicemail.  She is asked to call back for any questions or concerns.

## 2025-01-09 NOTE — Addendum Note (Signed)
 Addended by: Caydin Yeatts H on: 01/09/2025 01:06 PM   Modules accepted: Orders

## 2025-01-09 NOTE — Telephone Encounter (Signed)
 CMP ordered & CT scheduled for 2/6 at 11:00 am, patient to arrive by 9 am at [facility].

## 2025-01-09 NOTE — Telephone Encounter (Signed)
-----   Message from Aloha Finner, MD sent at 01/08/2025  5:05 PM EST ----- Regarding: RE: EUS/FNA I think an EUS is very reasonable for this patient. I would recommend that repeat CMP is checked (can place under Dr. Ira name). With the elevated CA 19-9, I think we need to try to have her procedure done within the next 3 weeks. CMP can be done within the next week. If the procedure is scheduled out to around the 3-week mark, then I recommend within a few days prior to that she undergo a CT abdomen with IV and oral contrast to evaluate if there has been further significant change in her pancreatic lesion.  Pod B nurses, As Odetta is out currently, can you work on this above? Thanks. GM ----- Message ----- From: Ever Greig RAMAN, PA-C Sent: 01/04/2025  11:24 AM EST To: Odetta LITTIE Curly, RN; Aloha Finner Raddle., # Subject: YONG Bass, this patient has been admitted at [facility] over the past few days with acute pancreatitis with symptoms present for a couple of weeks prior to admission which had gradually worsened to the point of constant pain with nausea and vomiting  First episode of pancreatitis, and etiology not clear  CA 19-9 elevated at 168 CEA normal IgG4 pending  MRI/MRCP-with a mildly T2 hyperintense/heterogeneous 2.6 x 3.5 cm lesion centered adjacent to the pancreatic head however small portion extending into the pancreatic head, thick irregular enhancing walls and central nonenhancing areas  Unclear whether this represents a fluid collection/pseudocyst versus neoplasm  She will be Dr. Ira patient as new to our practice Please review her imaging and hopefully we can get her scheduled for EUS/possible FNA very soon as outpatient   Patient aware that office will contact her next week with appointment scheduling etc.  Also need to be seen back by Dr. Charlanne once these more acute issues get sorted out, to discuss  colonoscopy.  Thank you

## 2025-01-12 ENCOUNTER — Ambulatory Visit: Admitting: Gastroenterology

## 2025-01-13 NOTE — Progress Notes (Unsigned)
 "  Chief Complaint:*** Primary GI Doctor:***  HPI:  Patient is a  ***  year old female/female patient with past medical history of *****who was referred to me by Domenica Harlene LABOR, MD on **** for a evaluation of *** .    Interval History  Patient admits/denies GERD Patient admits/denies dysphagia Patient admits/denies nausea, vomiting, or weight loss  Patient admits/denies altered bowel habits Patient admits/denies abdominal pain Patient admits/denies rectal bleeding   Denies/Admits alcohol Denies/Admits smoking Denies/Admits NSAID use. Denies/Admits they are on blood thinners.  Patients last colonoscopy Patients last EGD  Surgical history:  Patient's family history includes  Wt Readings from Last 3 Encounters:  01/01/25 117 lb 8.1 oz (53.3 kg)  12/26/24 117 lb (53.1 kg)  09/30/24 124 lb 6.4 oz (56.4 kg)      Past Medical History:  Diagnosis Date   Allergy    Anxiety and depression 02/06/2016   Cervical cancer screening 10/12/2016   Menarche at 12 Regular and moderate flow one history of abnormal pap in past at age 58, ASGUS, frozen then testing normal G3P**, s/p 3 SVA No history of abnormal MGM Noconcerns today Cryotherapy and tubal: gyn surgeries   GERD (gastroesophageal reflux disease)    History of chicken pox    Hx of pancreatitis    Hyperlipidemia 01/08/2017   Pancreatic mass 12/23/2024   CT scan   Pancreatitis    Tobacco abuse disorder 10/12/2016   Viral respiratory illness 01/14/2017    Past Surgical History:  Procedure Laterality Date   TUBAL LIGATION  about 20 yrs ago    Current Outpatient Medications  Medication Sig Dispense Refill   acetaminophen  (TYLENOL ) 325 MG tablet Take 650 mg by mouth every 6 (six) hours as needed for headache. (Patient not taking: Reported on 01/01/2025)     diazepam  (VALIUM ) 5 MG tablet Take 5 mg by mouth daily as needed.     Fluticasone-Umeclidin-Vilant (TRELEGY ELLIPTA ) 200-62.5-25 MCG/ACT AEPB Inhale 1 puff into the  lungs daily. 60 each 11   ondansetron  (ZOFRAN -ODT) 8 MG disintegrating tablet Take 1 tablet (8 mg total) by mouth every 8 (eight) hours as needed for nausea or vomiting. 20 tablet 0   oxyCODONE  (OXY IR/ROXICODONE ) 5 MG immediate release tablet Take 1 tablet (5 mg total) by mouth every 6 (six) hours as needed for moderate pain (pain score 4-6) or severe pain (pain score 7-10). 30 tablet 0   promethazine  (PHENERGAN ) 25 MG tablet Take 1 tablet (25 mg total) by mouth every 8 (eight) hours as needed for nausea or vomiting. 20 tablet 0   No current facility-administered medications for this visit.    Allergies as of 01/14/2025 - Review Complete 01/01/2025  Allergen Reaction Noted   Lipitor [atorvastatin ]  05/27/2024    Family History  Problem Relation Age of Onset   Diverticulitis Mother    Drug abuse Father    Cancer Father        colon cancer   Cirrhosis Sister    Alcohol abuse Sister    Diabetes Maternal Grandmother    Cancer Daughter        skin   Aneurysm Maternal Aunt        brain    Review of Systems:    Constitutional: No weight loss, fever, chills, weakness or fatigue HEENT: Eyes: No change in vision               Ears, Nose, Throat:  No change in hearing or congestion Skin: No rash or  itching Cardiovascular: No chest pain, chest pressure or palpitations   Respiratory: No SOB or cough Gastrointestinal: See HPI and otherwise negative Genitourinary: No dysuria or change in urinary frequency Neurological: No headache, dizziness or syncope Musculoskeletal: No new muscle or joint pain Hematologic: No bleeding or bruising Psychiatric: No history of depression or anxiety    Physical Exam:  Vital signs: LMP 01/08/2017   Constitutional:   Pleasant *** female/female appears to be in NAD, Well developed, Well nourished, alert and cooperative Eyes:   PEERL, EOMI. No icterus. Conjunctiva pink. Neck:  Supple Throat: Oral cavity and pharynx without inflammation, swelling or  lesion.  Respiratory: Respirations even and unlabored. Lungs clear to auscultation bilaterally.   No wheezes, crackles, or rhonchi.  Cardiovascular: Normal S1, S2. Regular rate and rhythm. No peripheral edema, cyanosis or pallor.  Gastrointestinal:  Soft, nondistended, nontender. No rebound or guarding. Normal bowel sounds. No appreciable masses or hepatomegaly. Rectal:  Not performed.  Anoscopy: Msk:  Symmetrical without gross deformities. Without edema, no deformity or joint abnormality.  Neurologic:  Alert and  oriented x4;  grossly normal neurologically.  Skin:   Dry and intact without significant lesions or rashes.  RELEVANT LABS AND IMAGING: CBC    Latest Ref Rng & Units 01/04/2025    3:29 AM 01/02/2025    3:05 AM 01/01/2025    1:41 PM  CBC  WBC 4.0 - 10.5 K/uL 9.9  10.8  11.8   Hemoglobin 12.0 - 15.0 g/dL 88.2  87.7  85.2   Hematocrit 36.0 - 46.0 % 33.7  35.5  42.2   Platelets 150 - 400 K/uL 306  324  395      CMP     Latest Ref Rng & Units 01/03/2025    5:39 AM 01/02/2025    3:05 AM 01/01/2025    1:41 PM  CMP  Glucose 70 - 99 mg/dL 98  94  889   BUN 6 - 20 mg/dL 6  8  7    Creatinine 0.44 - 1.00 mg/dL 9.44  9.38  9.35   Sodium 135 - 145 mmol/L 137  135  136   Potassium 3.5 - 5.1 mmol/L 4.4  3.2  3.5   Chloride 98 - 111 mmol/L 102  96  95   CO2 22 - 32 mmol/L 25  28  30    Calcium  8.9 - 10.3 mg/dL 8.6  8.8  9.4   Total Protein 6.5 - 8.1 g/dL  6.4  7.7   Total Bilirubin 0.0 - 1.2 mg/dL  0.4  0.4   Alkaline Phos 38 - 126 U/L  58  69   AST 15 - 41 U/L  12  15   ALT 0 - 44 U/L  7  10      Lab Results  Component Value Date   TSH 1.34 10/23/2019     Assessment: 1. ***  Under staff message:  I think an EUS is very reasonable for this patient.  I would recommend that repeat CMP is checked (can place under Dr. Ira name).  With the elevated CA 19-9, I think we need to try to have her procedure done within the next 3 weeks.  CMP can be done within the next week.   If the procedure is scheduled out to around the 3-week mark, then I recommend within a few days prior to that she undergo a CT abdomen with IV and oral contrast to evaluate if there has been further significant change in her pancreatic  lesion.   Plan: 1. ***   Thank you for the courtesy of this consult. Please call me with any questions or concerns.   Zaniah Titterington, FNP-C [facility] 01/13/2025, 8:32 AM  Cc: Domenica Harlene LABOR, MD  "

## 2025-01-14 ENCOUNTER — Encounter: Payer: Self-pay | Admitting: Gastroenterology

## 2025-01-14 ENCOUNTER — Ambulatory Visit: Admitting: Gastroenterology

## 2025-01-14 ENCOUNTER — Other Ambulatory Visit: Payer: Self-pay

## 2025-01-14 ENCOUNTER — Other Ambulatory Visit

## 2025-01-14 VITALS — BP 110/80 | HR 82 | Ht 60.0 in | Wt 112.4 lb

## 2025-01-14 DIAGNOSIS — K869 Disease of pancreas, unspecified: Secondary | ICD-10-CM

## 2025-01-14 DIAGNOSIS — K859 Acute pancreatitis without necrosis or infection, unspecified: Secondary | ICD-10-CM | POA: Diagnosis not present

## 2025-01-14 DIAGNOSIS — K5903 Drug induced constipation: Secondary | ICD-10-CM

## 2025-01-14 DIAGNOSIS — R112 Nausea with vomiting, unspecified: Secondary | ICD-10-CM | POA: Diagnosis not present

## 2025-01-14 DIAGNOSIS — R978 Other abnormal tumor markers: Secondary | ICD-10-CM

## 2025-01-14 DIAGNOSIS — R9389 Abnormal findings on diagnostic imaging of other specified body structures: Secondary | ICD-10-CM

## 2025-01-14 DIAGNOSIS — R1011 Right upper quadrant pain: Secondary | ICD-10-CM

## 2025-01-14 LAB — COMPREHENSIVE METABOLIC PANEL WITH GFR
ALT: 10 U/L (ref 3–35)
AST: 14 U/L (ref 5–37)
Albumin: 4.1 g/dL (ref 3.5–5.2)
Alkaline Phosphatase: 61 U/L (ref 39–117)
BUN: 6 mg/dL (ref 6–23)
CO2: 32 meq/L (ref 19–32)
Calcium: 9.7 mg/dL (ref 8.4–10.5)
Chloride: 94 meq/L — ABNORMAL LOW (ref 96–112)
Creatinine, Ser: 0.6 mg/dL (ref 0.40–1.20)
GFR: 98.97 mL/min
Glucose, Bld: 119 mg/dL — ABNORMAL HIGH (ref 70–99)
Potassium: 4 meq/L (ref 3.5–5.1)
Sodium: 134 meq/L — ABNORMAL LOW (ref 135–145)
Total Bilirubin: 0.3 mg/dL (ref 0.2–1.2)
Total Protein: 7.6 g/dL (ref 6.0–8.3)

## 2025-01-14 MED ORDER — OXYCODONE HCL 5 MG PO TABS: 5.0000 mg | ORAL_TABLET | Freq: Four times a day (QID) | ORAL | 0 refills | Status: AC | PRN

## 2025-01-14 NOTE — Patient Instructions (Addendum)
 Nausea -continue antiemetics prn  Abdominal pain -continue oxycodone  prn, refilled  Constipation -samples of Linzess 72 mcg po daily -Add OTC stool softeners prn -drink plenty of water  Pancreatitis -low fat diet, smaller meals -avoid alcohol   Advised to go to the ER if there is any severe abdominal pain, unable to hold down food/water, blood in stool or vomit, chest pain, shortness of breath, or any worsening symptoms.     Your provider has requested that you go to the basement level for lab work before leaving today. Press B on the elevator. The lab is located at the first door on the left as you exit the elevator.   You have been scheduled for a CT scan of the abdomen and pelvis at [facility] [place], 1st floor Radiology. You are scheduled on 01/23/25 at 11:00am. You should arrive 15 minutes prior to your appointment time for registration.   You may take any medications as prescribed with a small amount of water, if necessary. If you take any of the following medications: METFORMIN, GLUCOPHAGE, GLUCOVANCE, AVANDAMET, RIOMET, FORTAMET, ACTOPLUS MET, JANUMET, GLUMETZA or METAGLIP, you MAY be asked to HOLD this medication 48 hours AFTER the exam.   The purpose of you drinking the oral contrast is to aid in the visualization of your intestinal tract. The contrast solution may cause some diarrhea. Depending on your individual set of symptoms, you may also receive an intravenous injection of x-ray contrast/dye. Plan on being at [facility] for 45 minutes or longer, depending on the type of exam you are having performed.   If you have any questions regarding your exam or if you need to reschedule, you may call [facility] at [phone] between the hours of 8:00 am and 5:00 pm, Monday-Friday.  _______________________________________________________  If your blood pressure at your visit was 140/90 or greater, please contact your primary care physician to follow up on  this.  _______________________________________________________  If you are age 59 or older, your body mass index should be between 23-30. Your Body mass index is 21.95 kg/m. If this is out of the aforementioned range listed, please consider follow up with your Primary Care Provider.  If you are age 59 or younger, your body mass index should be between 19-25. Your Body mass index is 21.95 kg/m. If this is out of the aformentioned range listed, please consider follow up with your Primary Care Provider.   ________________________________________________________  The [facility] providers would like to encourage you to use MYCHART to communicate with providers for non-urgent requests or questions.  Due to long hold times on the telephone, sending your provider a message by [facility] may be a faster and more efficient way to get a response.  Please allow 48 business hours for a response.  Please remember that this is for non-urgent requests.  _______________________________________________________  [facility] is using a team-based approach to care.  Your team is made up of your doctor and two to three APPS. Our APPS (Nurse Practitioners and Physician Assistants) work with your physician to ensure care continuity for you. They are fully qualified to address your health concerns and develop a treatment plan. They communicate directly with your gastroenterologist to care for you. Seeing the Advanced Practice Practitioners on your physician's team can help you by facilitating care more promptly, often allowing for earlier appointments, access to diagnostic testing, procedures, and other specialty referrals.   Thank you for trusting me with your gastrointestinal care. Deanna May, FNP-C

## 2025-01-14 NOTE — Telephone Encounter (Signed)
 Patient has been scheduled for Upper EUS with Dr Wilhelmenia  03/09/25 @ [facility]. All information will be given to patient today at her visit with Deanna, NP.

## 2025-01-14 NOTE — Addendum Note (Signed)
 Addended byBETHA SUELLEN PEERS on: 01/14/2025 01:46 PM   Modules accepted: Orders

## 2025-01-15 ENCOUNTER — Ambulatory Visit: Payer: Self-pay | Admitting: Gastroenterology

## 2025-01-16 ENCOUNTER — Telehealth: Payer: Self-pay

## 2025-01-16 NOTE — Telephone Encounter (Signed)
 CT order with demographics sent to the fax number provided.

## 2025-01-16 NOTE — Telephone Encounter (Signed)
 Updated CT & changed locations. Patient is aware & has been provided GI number and advised to call by next week if she has not heard from them regarding an appt. Pt verbalized all understanding.

## 2025-01-16 NOTE — Telephone Encounter (Signed)
-----   Message from April M sent at 01/16/2025 11:34 AM EST ----- Regarding: CT LOCATION CHANGE Hello, [employer] benefits would not approve any [facility] facilities. I did get her CT scan approved at the location listed below. If you can change the location in EPIC and let the pt know.  Order ID: [id number] Completed Approval Valid Through: 01/16/2025 - 02/14/2025  [facility] [address] [place] , [place] [address] Phone: [phone]   Thank you.

## 2025-01-16 NOTE — Telephone Encounter (Signed)
 Inbound call from patient's insurance stating patient is scheduled next for CT at [facility]. Requesting orders to be faxed to [phone]. Please advise, thank you

## 2025-01-16 NOTE — Addendum Note (Signed)
 Addended by: Tahira Olivarez H on: 01/16/2025 11:50 AM   Modules accepted: Orders

## 2025-01-20 ENCOUNTER — Other Ambulatory Visit: Payer: Self-pay

## 2025-01-20 ENCOUNTER — Other Ambulatory Visit (INDEPENDENT_AMBULATORY_CARE_PROVIDER_SITE_OTHER)

## 2025-01-20 DIAGNOSIS — K869 Disease of pancreas, unspecified: Secondary | ICD-10-CM

## 2025-01-20 DIAGNOSIS — K859 Acute pancreatitis without necrosis or infection, unspecified: Secondary | ICD-10-CM | POA: Diagnosis not present

## 2025-01-20 LAB — LIPASE: Lipase: 510 U/L — ABNORMAL HIGH (ref 11.0–59.0)

## 2025-01-21 ENCOUNTER — Telehealth: Payer: Self-pay

## 2025-01-21 NOTE — Telephone Encounter (Signed)
 Spoke with Monica Dillon @ [facility] who stated that there office is not comfortable proceeding with the CT scan for this pt due to the fact that their CT scan machine is older and only a 16 slice and they are concerned that they may miss something.  Pt chart was reviewed and noted.     ----- Message from April M sent at 01/16/2025 11:34 AM EST ----- Regarding: CT LOCATION CHANGE Hello, [employer] benefits would not approve any [facility] facilities. I did get her CT scan approved at the location listed below. If you can change the location in EPIC and let the pt know.   Order ID: [id number] Completed Approval Valid Through: 01/16/2025 - 02/14/2025   [facility] [address] [place] , [place] [address] Phone: [phone]    Thank you    [facility] [address] [place] , [place] was contacted and requested that this Pt CT scan be scheduled.  Their office stated that their office will reach out to the pt and get her scheduled.  Pt made aware.  Pt verbalized understanding with all questions answered.

## 2025-01-21 NOTE — Telephone Encounter (Signed)
 Patient made aware paperwork will be on the second floor for her to pick up. She was given the original paperwork back and her other paper work was in the folder that was given and placed at [facility] yellow folder for her to pick up in the truist  Papers faxed to [phone] for [employer] successfully

## 2025-01-21 NOTE — Telephone Encounter (Signed)
 Received fax from [facility] stating that the pt will need to be referred to another facility for the CT scan.  Left message for Kristie Ned @ [facility] [phone] to give our office a call back.

## 2025-01-23 ENCOUNTER — Ambulatory Visit (HOSPITAL_BASED_OUTPATIENT_CLINIC_OR_DEPARTMENT_OTHER)

## 2025-01-26 ENCOUNTER — Inpatient Hospital Stay: Admission: RE | Admit: 2025-01-26

## 2025-03-09 ENCOUNTER — Encounter (HOSPITAL_COMMUNITY): Payer: Self-pay

## 2025-03-09 ENCOUNTER — Ambulatory Visit (HOSPITAL_COMMUNITY): Admit: 2025-03-09 | Admitting: Gastroenterology
